# Patient Record
Sex: Female | Born: 1990 | Race: Black or African American | Hispanic: No | Marital: Single | Smoking: Never smoker
Health system: Southern US, Community
[De-identification: ages and names within clinical notes are randomized; demographics above are authoritative.]

## PROBLEM LIST (undated history)

## (undated) DIAGNOSIS — F419 Anxiety disorder, unspecified: Secondary | ICD-10-CM

## (undated) DIAGNOSIS — Z789 Other specified health status: Secondary | ICD-10-CM

## (undated) HISTORY — PX: TONSILLECTOMY: SUR1361

---

## 2015-09-16 NOTE — L&D Delivery Note (Signed)
Delivery Note Patient is a 25 y.o. now [id number] who admitted for SOL with SROM at 11:30pm, now s/p NSVD at [id number]  At 1:40 AM a viable female was delivered via Vaginal, Spontaneous Delivery. Head delivered LOA. No nuchal cord present. Shoulder and body delivered in usual fashion. Infant to mother's abdomen. Cord clamped x 2 after 1-minute delay, and cut by FOB. Cord blood drawn. Placenta delivered with gentle cord traction. Fundus firm with massage and Pitocin. Perineum inspected and found to have only a perineal abraison, which was found to be hemostatic.  Placenta status: intact.   Cord:  3-vessel  Anesthesia:  Epidural Episiotomy: None Lacerations: None Suture Repair: none Est. Blood Loss (mL): 100  Mom to postpartum.  Baby to Couplet care / Skin to Skin.  Kandra NicolasJulie P Degele 05/31/2016, 2:11 AM  I have seen and examined this patient and I agree with the above. Cam HaiSHAW, Awanda Wilcock CNM 9:17 AM 05/31/2016

## 2016-05-10 ENCOUNTER — Encounter (HOSPITAL_COMMUNITY): Payer: Self-pay | Admitting: *Deleted

## 2016-05-10 ENCOUNTER — Inpatient Hospital Stay (HOSPITAL_COMMUNITY)
Admission: AD | Admit: 2016-05-10 | Discharge: 2016-05-10 | Disposition: A | Discharge status: Home / Self Care | Payer: Medicaid Other | Source: Ambulatory Visit | Attending: Obstetrics & Gynecology | Admitting: Obstetrics & Gynecology

## 2016-05-10 DIAGNOSIS — O471 False labor at or after 37 completed weeks of gestation: Secondary | ICD-10-CM | POA: Diagnosis present

## 2016-05-10 DIAGNOSIS — Z3A38 38 weeks gestation of pregnancy: Secondary | ICD-10-CM | POA: Insufficient documentation

## 2016-05-10 HISTORY — DX: Other specified health status: Z78.9

## 2016-05-10 HISTORY — DX: Anxiety disorder, unspecified: F41.9

## 2016-05-10 LAB — RAPID URINE DRUG SCREEN, HOSP PERFORMED
AMPHETAMINES: NOT DETECTED
BENZODIAZEPINES: NOT DETECTED
Barbiturates: NOT DETECTED
COCAINE: NOT DETECTED
OPIATES: NOT DETECTED
TETRAHYDROCANNABINOL: NOT DETECTED

## 2016-05-10 LAB — URINALYSIS, ROUTINE W REFLEX MICROSCOPIC
Glucose, UA: NEGATIVE mg/dL
HGB URINE DIPSTICK: NEGATIVE
Ketones, ur: 40 mg/dL — AB
LEUKOCYTES UA: NEGATIVE
NITRITE: NEGATIVE
PROTEIN: 30 mg/dL — AB
pH: 6 (ref 5.0–8.0)

## 2016-05-10 LAB — URINE MICROSCOPIC-ADD ON

## 2016-05-10 NOTE — MAU Note (Signed)
Pt states she started having contractions this morning.  Pt states she got in an argument with her mom and then her contractions got worse.

## 2016-05-10 NOTE — Discharge Instructions (Signed)

## 2016-05-20 ENCOUNTER — Encounter (HOSPITAL_COMMUNITY): Payer: Self-pay | Admitting: *Deleted

## 2016-05-20 ENCOUNTER — Inpatient Hospital Stay (HOSPITAL_COMMUNITY)
Admission: AD | Admit: 2016-05-20 | Discharge: 2016-05-20 | Disposition: A | Discharge status: Home / Self Care | Payer: Medicaid Other | Source: Ambulatory Visit | Attending: Family Medicine | Admitting: Family Medicine

## 2016-05-20 DIAGNOSIS — Z3A00 Weeks of gestation of pregnancy not specified: Secondary | ICD-10-CM | POA: Diagnosis not present

## 2016-05-20 DIAGNOSIS — Z3493 Encounter for supervision of normal pregnancy, unspecified, third trimester: Secondary | ICD-10-CM | POA: Diagnosis not present

## 2016-05-20 NOTE — Progress Notes (Signed)
Notified of pt arrival in MAU and complaint, SVE, and FHR tracing. OK to discharge home and follow up with OB. Labor reassurance given.

## 2016-05-20 NOTE — MAU Note (Signed)
PT SAYS DECREASED FETAL  MOVEMENT  -  IN CAR.    HEADACHE  STARTED    5 PM-    NO MEDS,.     FEELS UC- STRONG  AT 2 PM.    [facility]-     [facility] - HAS MOVED TO [place]  AND  WANTS  TO COME  HERE-  BUT  HAS  NOT  TRANSFERRED  PNR OR  DR.        VE IN OFFICE  ON WED -  VE   3  CM.    DENIES HSV AND MRSA     GBS- POSITIVE

## 2016-05-20 NOTE — Discharge Instructions (Signed)
Third Trimester of Pregnancy °The third trimester is from week 29 through week 42, months 7 through 9. The third trimester is a time when the fetus is growing rapidly. At the end of the ninth month, the fetus is about 20 inches in length and weighs 6-10 pounds.  °BODY CHANGES °Your body goes through many changes during pregnancy. The changes vary from woman to woman.  °· Your weight will continue to increase. You can expect to gain 25-35 pounds (11-16 kg) by the end of the pregnancy. °· You may begin to get stretch marks on your hips, abdomen, and breasts. °· You may urinate more often because the fetus is moving lower into your pelvis and pressing on your bladder. °· You may develop or continue to have heartburn as a result of your pregnancy. °· You may develop constipation because certain hormones are causing the muscles that push waste through your intestines to slow down. °· You may develop hemorrhoids or swollen, bulging veins (varicose veins). °· You may have pelvic pain because of the weight gain and pregnancy hormones relaxing your joints between the bones in your pelvis. Backaches may result from overexertion of the muscles supporting your posture. °· You may have changes in your hair. These can include thickening of your hair, rapid growth, and changes in texture. Some women also have hair loss during or after pregnancy, or hair that feels dry or thin. Your hair will most likely return to normal after your baby is born. °· Your breasts will continue to grow and be tender. A yellow discharge may leak from your breasts called colostrum. °· Your belly button may stick out. °· You may feel short of breath because of your expanding uterus. °· You may notice the fetus "dropping," or moving lower in your abdomen. °· You may have a bloody mucus discharge. This usually occurs a few days to a week before labor begins. °· Your cervix becomes thin and soft (effaced) near your due date. °WHAT TO EXPECT AT YOUR PRENATAL  EXAMS  °You will have prenatal exams every 2 weeks until week 36. Then, you will have weekly prenatal exams. During a routine prenatal visit: °· You will be weighed to make sure you and the fetus are growing normally. °· Your blood pressure is taken. °· Your abdomen will be measured to track your baby's growth. °· The fetal heartbeat will be listened to. °· Any test results from the previous visit will be discussed. °· You may have a cervical check near your due date to see if you have effaced. °At around 36 weeks, your caregiver will check your cervix. At the same time, your caregiver will also perform a test on the secretions of the vaginal tissue. This test is to determine if a type of bacteria, Group B streptococcus, is present. Your caregiver will explain this further. °Your caregiver may ask you: °· What your birth plan is. °· How you are feeling. °· If you are feeling the baby move. °· If you have had any abnormal symptoms, such as leaking fluid, bleeding, severe headaches, or abdominal cramping. °· If you are using any tobacco products, including cigarettes, chewing tobacco, and electronic cigarettes. °· If you have any questions. °Other tests or screenings that may be performed during your third trimester include: °· Blood tests that check for low iron levels (anemia). °· Fetal testing to check the health, activity level, and growth of the fetus. Testing is done if you have certain medical conditions or if   there are problems during the pregnancy. °· HIV (human immunodeficiency virus) testing. If you are at high risk, you may be screened for HIV during your third trimester of pregnancy. °FALSE LABOR °You may feel small, irregular contractions that eventually go away. These are called Braxton Hicks contractions, or false labor. Contractions may last for hours, days, or even weeks before true labor sets in. If contractions come at regular intervals, intensify, or become painful, it is best to be seen by your  caregiver.  °SIGNS OF LABOR  °· Menstrual-like cramps. °· Contractions that are 5 minutes apart or less. °· Contractions that start on the top of the uterus and spread down to the lower abdomen and back. °· A sense of increased pelvic pressure or back pain. °· A watery or bloody mucus discharge that comes from the vagina. °If you have any of these signs before the 37th week of pregnancy, call your caregiver right away. You need to go to the hospital to get checked immediately. °HOME CARE INSTRUCTIONS  °· Avoid all smoking, herbs, alcohol, and unprescribed drugs. These chemicals affect the formation and growth of the baby. °· Do not use any tobacco products, including cigarettes, chewing tobacco, and electronic cigarettes. If you need help quitting, ask your health care provider. You may receive counseling support and other resources to help you quit. °· Follow your caregiver's instructions regarding medicine use. There are medicines that are either safe or unsafe to take during pregnancy. °· Exercise only as directed by your caregiver. Experiencing uterine cramps is a good sign to stop exercising. °· Continue to eat regular, healthy meals. °· Wear a good support bra for breast tenderness. °· Do not use hot tubs, steam rooms, or saunas. °· Wear your seat belt at all times when driving. °· Avoid raw meat, uncooked cheese, cat litter boxes, and soil used by cats. These carry germs that can cause birth defects in the baby. °· Take your prenatal vitamins. °· Take 1500-2000 mg of calcium daily starting at the 20th week of pregnancy until you deliver your baby. °· Try taking a stool softener (if your caregiver approves) if you develop constipation. Eat more high-fiber foods, such as fresh vegetables or fruit and whole grains. Drink plenty of fluids to keep your urine clear or pale yellow. °· Take warm sitz baths to soothe any pain or discomfort caused by hemorrhoids. Use hemorrhoid cream if your caregiver approves. °· If  you develop varicose veins, wear support hose. Elevate your feet for 15 minutes, 3-4 times a day. Limit salt in your diet. °· Avoid heavy lifting, wear low heal shoes, and practice good posture. °· Rest a lot with your legs elevated if you have leg cramps or low back pain. °· Visit your dentist if you have not gone during your pregnancy. Use a soft toothbrush to brush your teeth and be gentle when you floss. °· A sexual relationship may be continued unless your caregiver directs you otherwise. °· Do not travel far distances unless it is absolutely necessary and only with the approval of your caregiver. °· Take prenatal classes to understand, practice, and ask questions about the labor and delivery. °· Make a trial run to the hospital. °· Pack your hospital bag. °· Prepare the baby's nursery. °· Continue to go to all your prenatal visits as directed by your caregiver. °SEEK MEDICAL CARE IF: °· You are unsure if you are in labor or if your water has broken. °· You have dizziness. °· You have   mild pelvic cramps, pelvic pressure, or nagging pain in your abdominal area. °· You have persistent nausea, vomiting, or diarrhea. °· You have a bad smelling vaginal discharge. °· You have pain with urination. °SEEK IMMEDIATE MEDICAL CARE IF:  °· You have a fever. °· You are leaking fluid from your vagina. °· You have spotting or bleeding from your vagina. °· You have severe abdominal cramping or pain. °· You have rapid weight loss or gain. °· You have shortness of breath with chest pain. °· You notice sudden or extreme swelling of your face, hands, ankles, feet, or legs. °· You have not felt your baby move in over an hour. °· You have severe headaches that do not go away with medicine. °· You have vision changes. °  °This information is not intended to replace advice given to you by your health care provider. Make sure you discuss any questions you have with your health care provider. °  °Document Released: 08/26/2001 Document  Revised: 09/22/2014 Document Reviewed: 11/02/2012 °Elsevier Interactive Patient Education ©2016 [employer] °Fetal Movement Counts °Patient Name: __________________________________________________ Patient Due Date: ____________________ °Performing a fetal movement count is highly recommended in high-risk pregnancies, but it is good for every pregnant woman to do. Your health care provider may ask you to start counting fetal movements at 28 weeks of the pregnancy. Fetal movements often increase: °· After eating a full meal. °· After physical activity. °· After eating or drinking something sweet or cold. °· At rest. °Pay attention to when you feel the baby is most active. This will help you notice a pattern of your baby's sleep and wake cycles and what factors contribute to an increase in fetal movement. It is important to perform a fetal movement count at the same time each day when your baby is normally most active.  °HOW TO COUNT FETAL MOVEMENTS °1. Find a quiet and comfortable area to sit or lie down on your left side. Lying on your left side provides the best blood and oxygen circulation to your baby. °2. Write down the day and time on a sheet of paper or in a journal. °3. Start counting kicks, flutters, swishes, rolls, or jabs in a 2-hour period. You should feel at least 10 movements within 2 hours. °4. If you do not feel 10 movements in 2 hours, wait 2-3 hours and count again. Look for a change in the pattern or not enough counts in 2 hours. °SEEK MEDICAL CARE IF: °· You feel less than 10 counts in 2 hours, tried twice. °· There is no movement in over an hour. °· The pattern is changing or taking longer each day to reach 10 counts in 2 hours. °· You feel the baby is not moving as he or she usually does. °Date: ____________ Movements: ____________ Start time: ____________ Finish time: ____________  °Date: ____________ Movements: ____________ Start time: ____________ Finish time: ____________ °Date:  ____________ Movements: ____________ Start time: ____________ Finish time: ____________ °Date: ____________ Movements: ____________ Start time: ____________ Finish time: ____________ °Date: ____________ Movements: ____________ Start time: ____________ Finish time: ____________ °Date: ____________ Movements: ____________ Start time: ____________ Finish time: ____________ °Date: ____________ Movements: ____________ Start time: ____________ Finish time: ____________ °Date: ____________ Movements: ____________ Start time: ____________ Finish time: ____________  °Date: ____________ Movements: ____________ Start time: ____________ Finish time: ____________ °Date: ____________ Movements: ____________ Start time: ____________ Finish time: ____________ °Date: ____________ Movements: ____________ Start time: ____________ Finish time: ____________ °Date: ____________ Movements: ____________ Start time: ____________ Finish time: ____________ °Date:   ____________ Movements: ____________ Start time: ____________ Finish time: ____________ °Date: ____________ Movements: ____________ Start time: ____________ Finish time: ____________ °Date: ____________ Movements: ____________ Start time: ____________ Finish time: ____________  °Date: ____________ Movements: ____________ Start time: ____________ Finish time: ____________ °Date: ____________ Movements: ____________ Start time: ____________ Finish time: ____________ °Date: ____________ Movements: ____________ Start time: ____________ Finish time: ____________ °Date: ____________ Movements: ____________ Start time: ____________ Finish time: ____________ °Date: ____________ Movements: ____________ Start time: ____________ Finish time: ____________ °Date: ____________ Movements: ____________ Start time: ____________ Finish time: ____________ °Date: ____________ Movements: ____________ Start time: ____________ Finish time: ____________  °Date: ____________ Movements: ____________ Start  time: ____________ Finish time: ____________ °Date: ____________ Movements: ____________ Start time: ____________ Finish time: ____________ °Date: ____________ Movements: ____________ Start time: ____________ Finish time: ____________ °Date: ____________ Movements: ____________ Start time: ____________ Finish time: ____________ °Date: ____________ Movements: ____________ Start time: ____________ Finish time: ____________ °Date: ____________ Movements: ____________ Start time: ____________ Finish time: ____________ °Date: ____________ Movements: ____________ Start time: ____________ Finish time: ____________  °Date: ____________ Movements: ____________ Start time: ____________ Finish time: ____________ °Date: ____________ Movements: ____________ Start time: ____________ Finish time: ____________ °Date: ____________ Movements: ____________ Start time: ____________ Finish time: ____________ °Date: ____________ Movements: ____________ Start time: ____________ Finish time: ____________ °Date: ____________ Movements: ____________ Start time: ____________ Finish time: ____________ °Date: ____________ Movements: ____________ Start time: ____________ Finish time: ____________ °Date: ____________ Movements: ____________ Start time: ____________ Finish time: ____________  °Date: ____________ Movements: ____________ Start time: ____________ Finish time: ____________ °Date: ____________ Movements: ____________ Start time: ____________ Finish time: ____________ °Date: ____________ Movements: ____________ Start time: ____________ Finish time: ____________ °Date: ____________ Movements: ____________ Start time: ____________ Finish time: ____________ °Date: ____________ Movements: ____________ Start time: ____________ Finish time: ____________ °Date: ____________ Movements: ____________ Start time: ____________ Finish time: ____________ °Date: ____________ Movements: ____________ Start time: ____________ Finish time: ____________    °Date: ____________ Movements: ____________ Start time: ____________ Finish time: ____________ °Date: ____________ Movements: ____________ Start time: ____________ Finish time: ____________ °Date: ____________ Movements: ____________ Start time: ____________ Finish time: ____________ °Date: ____________ Movements: ____________ Start time: ____________ Finish time: ____________ °Date: ____________ Movements: ____________ Start time: ____________ Finish time: ____________ °Date: ____________ Movements: ____________ Start time: ____________ Finish time: ____________ °Date: ____________ Movements: ____________ Start time: ____________ Finish time: ____________  °Date: ____________ Movements: ____________ Start time: ____________ Finish time: ____________ °Date: ____________ Movements: ____________ Start time: ____________ Finish time: ____________ °Date: ____________ Movements: ____________ Start time: ____________ Finish time: ____________ °Date: ____________ Movements: ____________ Start time: ____________ Finish time: ____________ °Date: ____________ Movements: ____________ Start time: ____________ Finish time: ____________ °Date: ____________ Movements: ____________ Start time: ____________ Finish time: ____________ °  °This information is not intended to replace advice given to you by your health care provider. Make sure you discuss any questions you have with your health care provider. °  °Document Released: 10/01/2006 Document Revised: 09/22/2014 Document Reviewed: 06/28/2012 °[employer] Interactive Patient Education ©2016 [employer] °Braxton Hicks Contractions °Contractions of the uterus can occur throughout pregnancy. Contractions are not always a sign that you are in labor.  °WHAT ARE BRAXTON HICKS CONTRACTIONS?  °Contractions that occur before labor are called Braxton Hicks contractions, or false labor. Toward the end of pregnancy (32-34 weeks), these contractions can develop more often and may become more  forceful. This is not true labor because these contractions do not result in opening (dilatation) and thinning of the cervix. They are sometimes difficult to tell apart from true labor because these contractions can be forceful and people have different pain tolerances. You   should not feel embarrassed if you go to the hospital with false labor. Sometimes, the only way to tell if you are in true labor is for your health care provider to look for changes in the cervix. °If there are no prenatal problems or other health problems associated with the pregnancy, it is completely safe to be sent home with false labor and await the onset of true labor. °HOW CAN YOU TELL THE DIFFERENCE BETWEEN TRUE AND FALSE LABOR? °False Labor °· The contractions of false labor are usually shorter and not as hard as those of true labor.   °· The contractions are usually irregular.   °· The contractions are often felt in the front of the lower abdomen and in the groin.   °· The contractions may go away when you walk around or change positions while lying down.   °· The contractions get weaker and are shorter lasting as time goes on.   °· The contractions do not usually become progressively stronger, regular, and closer together as with true labor.   °True Labor °· Contractions in true labor last 30-70 seconds, become very regular, usually become more intense, and increase in frequency.   °· The contractions do not go away with walking.   °· The discomfort is usually felt in the top of the uterus and spreads to the lower abdomen and low back.   °· True labor can be determined by your health care provider with an exam. This will show that the cervix is dilating and getting thinner.   °WHAT TO REMEMBER °· Keep up with your usual exercises and follow other instructions given by your health care provider.   °· Take medicines as directed by your health care provider.   °· Keep your regular prenatal appointments.   °· Eat and drink lightly if you  think you are going into labor.   °· If Braxton Hicks contractions are making you uncomfortable:   °¨ Change your position from lying down or resting to walking, or from walking to resting.   °¨ Sit and rest in a tub of warm water.   °¨ Drink 2-3 glasses of water. Dehydration may cause these contractions.   °¨ Do slow and deep breathing several times an hour.   °WHEN SHOULD I SEEK IMMEDIATE MEDICAL CARE? °Seek immediate medical care if: °· Your contractions become stronger, more regular, and closer together.   °· You have fluid leaking or gushing from your vagina.   °· You have a fever.   °· You pass blood-tinged mucus.   °· You have vaginal bleeding.   °· You have continuous abdominal pain.   °· You have low back pain that you never had before.   °· You feel your baby's head pushing down and causing pelvic pressure.   °· Your baby is not moving as much as it used to.   °  °This information is not intended to replace advice given to you by your health care provider. Make sure you discuss any questions you have with your health care provider. °  °Document Released: 09/01/2005 Document Revised: 09/06/2013 Document Reviewed: 06/13/2013 °[employer]vier Interactive Patient Education ©2016 [employer] ° °

## 2016-05-31 ENCOUNTER — Inpatient Hospital Stay (HOSPITAL_COMMUNITY)
Admission: AD | Admit: 2016-05-31 | Discharge: 2016-06-02 | DRG: 775 | Disposition: A | Discharge status: Home / Self Care | Payer: Medicaid Other | Source: Ambulatory Visit | Attending: Obstetrics and Gynecology | Admitting: Obstetrics and Gynecology

## 2016-05-31 ENCOUNTER — Inpatient Hospital Stay (HOSPITAL_COMMUNITY): Payer: Medicaid Other | Admitting: Anesthesiology

## 2016-05-31 ENCOUNTER — Encounter (HOSPITAL_COMMUNITY): Payer: Self-pay | Admitting: *Deleted

## 2016-05-31 DIAGNOSIS — O99824 Streptococcus B carrier state complicating childbirth: Secondary | ICD-10-CM | POA: Diagnosis present

## 2016-05-31 DIAGNOSIS — IMO0001 Reserved for inherently not codable concepts without codable children: Secondary | ICD-10-CM

## 2016-05-31 DIAGNOSIS — Z3A40 40 weeks gestation of pregnancy: Secondary | ICD-10-CM | POA: Diagnosis not present

## 2016-05-31 DIAGNOSIS — O4202 Full-term premature rupture of membranes, onset of labor within 24 hours of rupture: Principal | ICD-10-CM | POA: Diagnosis present

## 2016-05-31 LAB — CBC
HCT: 31.2 % — ABNORMAL LOW (ref 36.0–46.0)
Hemoglobin: 10.8 g/dL — ABNORMAL LOW (ref 12.0–15.0)
MCH: 30.7 pg (ref 26.0–34.0)
MCHC: 34.6 g/dL (ref 30.0–36.0)
MCV: 88.6 fL (ref 78.0–100.0)
PLATELETS: 225 10*3/uL (ref 150–400)
RBC: 3.52 MIL/uL — ABNORMAL LOW (ref 3.87–5.11)
RDW: 13.7 % (ref 11.5–15.5)
WBC: 12.4 10*3/uL — ABNORMAL HIGH (ref 4.0–10.5)

## 2016-05-31 LAB — TYPE AND SCREEN
ABO/RH(D): A POS
Antibody Screen: NEGATIVE

## 2016-05-31 LAB — RPR: RPR: NONREACTIVE

## 2016-05-31 LAB — ABO/RH: ABO/RH(D): A POS

## 2016-05-31 MED ORDER — SENNOSIDES-DOCUSATE SODIUM 8.6-50 MG PO TABS
2.0000 | ORAL_TABLET | ORAL | Status: DC
  Administered 2016-05-31: 2 via ORAL
  Filled 2016-05-31 (×2): qty 2

## 2016-05-31 MED ORDER — SODIUM CHLORIDE 0.9 % IV SOLN
2.0000 g | Freq: Once | INTRAVENOUS | Status: AC
  Administered 2016-05-31: 2 g via INTRAVENOUS
  Filled 2016-05-31: qty 2000

## 2016-05-31 MED ORDER — ACETAMINOPHEN 325 MG PO TABS
650.0000 mg | ORAL_TABLET | ORAL | Status: DC | PRN
Start: 2016-05-31 — End: 2016-05-31

## 2016-05-31 MED ORDER — FLEET ENEMA 7-19 GM/118ML RE ENEM: 1.0000 | ENEMA | RECTAL | Status: DC | PRN

## 2016-05-31 MED ORDER — SOD CITRATE-CITRIC ACID 500-334 MG/5ML PO SOLN: 30.0000 mL | ORAL | Status: DC | PRN

## 2016-05-31 MED ORDER — OXYCODONE-ACETAMINOPHEN 5-325 MG PO TABS: 2.0000 | ORAL_TABLET | ORAL | Status: DC | PRN

## 2016-05-31 MED ORDER — EPHEDRINE 5 MG/ML INJ: 10.0000 mg | INTRAVENOUS | Status: DC | PRN

## 2016-05-31 MED ORDER — FENTANYL 2.5 MCG/ML BUPIVACAINE 1/10 % EPIDURAL INFUSION (WH - ANES)
14.0000 mL/h | INTRAMUSCULAR | Status: DC | PRN
  Administered 2016-05-31: 14 mL/h via EPIDURAL

## 2016-05-31 MED ORDER — LACTATED RINGERS IV SOLN: 500.0000 mL | INTRAVENOUS | Status: DC | PRN

## 2016-05-31 MED ORDER — FENTANYL 2.5 MCG/ML BUPIVACAINE 1/10 % EPIDURAL INFUSION (WH - ANES): 14.0000 mL/h | INTRAMUSCULAR | Status: DC | PRN

## 2016-05-31 MED ORDER — ACETAMINOPHEN 325 MG PO TABS
650.0000 mg | ORAL_TABLET | ORAL | Status: DC | PRN
  Administered 2016-05-31: 650 mg via ORAL
  Filled 2016-05-31: qty 2

## 2016-05-31 MED ORDER — LACTATED RINGERS IV SOLN: 500.0000 mL | Freq: Once | INTRAVENOUS | Status: DC

## 2016-05-31 MED ORDER — WITCH HAZEL-GLYCERIN EX PADS: 1.0000 "application " | MEDICATED_PAD | CUTANEOUS | Status: DC | PRN

## 2016-05-31 MED ORDER — OXYTOCIN 40 UNITS IN LACTATED RINGERS INFUSION - SIMPLE MED
2.5000 [IU]/h | INTRAVENOUS | Status: DC
  Filled 2016-05-31: qty 1000

## 2016-05-31 MED ORDER — DIBUCAINE 1 % RE OINT: 1.0000 "application " | TOPICAL_OINTMENT | RECTAL | Status: DC | PRN

## 2016-05-31 MED ORDER — PHENYLEPHRINE 40 MCG/ML (10ML) SYRINGE FOR IV PUSH (FOR BLOOD PRESSURE SUPPORT): 80.0000 ug | PREFILLED_SYRINGE | INTRAVENOUS | Status: DC | PRN

## 2016-05-31 MED ORDER — EPHEDRINE 5 MG/ML INJ
10.0000 mg | INTRAVENOUS | Status: DC | PRN
  Filled 2016-05-31: qty 4

## 2016-05-31 MED ORDER — ONDANSETRON HCL 4 MG PO TABS: 4.0000 mg | ORAL_TABLET | ORAL | Status: DC | PRN

## 2016-05-31 MED ORDER — OXYTOCIN BOLUS FROM INFUSION
500.0000 mL | Freq: Once | INTRAVENOUS | Status: AC
  Administered 2016-05-31: 500 mL via INTRAVENOUS

## 2016-05-31 MED ORDER — PHENYLEPHRINE 40 MCG/ML (10ML) SYRINGE FOR IV PUSH (FOR BLOOD PRESSURE SUPPORT)
80.0000 ug | PREFILLED_SYRINGE | INTRAVENOUS | Status: DC | PRN
  Filled 2016-05-31: qty 5

## 2016-05-31 MED ORDER — LACTATED RINGERS IV SOLN
INTRAVENOUS | Status: DC
  Administered 2016-05-31: 01:00:00 via INTRAVENOUS

## 2016-05-31 MED ORDER — PRENATAL MULTIVITAMIN CH
1.0000 | ORAL_TABLET | Freq: Every day | ORAL | Status: DC
  Administered 2016-05-31 – 2016-06-02 (×3): 1 via ORAL
  Filled 2016-05-31 (×3): qty 1

## 2016-05-31 MED ORDER — LIDOCAINE HCL (PF) 1 % IJ SOLN
INTRAMUSCULAR | Status: DC | PRN
  Administered 2016-05-31 (×2): 5 mL

## 2016-05-31 MED ORDER — DIPHENHYDRAMINE HCL 50 MG/ML IJ SOLN: 12.5000 mg | INTRAMUSCULAR | Status: DC | PRN

## 2016-05-31 MED ORDER — ZOLPIDEM TARTRATE 5 MG PO TABS: 5.0000 mg | ORAL_TABLET | Freq: Every evening | ORAL | Status: DC | PRN

## 2016-05-31 MED ORDER — IBUPROFEN 600 MG PO TABS
600.0000 mg | ORAL_TABLET | Freq: Four times a day (QID) | ORAL | Status: DC
  Administered 2016-05-31 – 2016-06-02 (×3): 600 mg via ORAL
  Filled 2016-05-31 (×8): qty 1

## 2016-05-31 MED ORDER — ONDANSETRON HCL 4 MG/2ML IJ SOLN: 4.0000 mg | Freq: Four times a day (QID) | INTRAMUSCULAR | Status: DC | PRN

## 2016-05-31 MED ORDER — ONDANSETRON HCL 4 MG/2ML IJ SOLN: 4.0000 mg | INTRAMUSCULAR | Status: DC | PRN

## 2016-05-31 MED ORDER — LIDOCAINE HCL (PF) 1 % IJ SOLN
30.0000 mL | INTRAMUSCULAR | Status: DC | PRN
  Filled 2016-05-31: qty 30

## 2016-05-31 MED ORDER — IBUPROFEN 600 MG PO TABS
600.0000 mg | ORAL_TABLET | Freq: Four times a day (QID) | ORAL | Status: DC | PRN
  Administered 2016-05-31 – 2016-06-01 (×2): 600 mg via ORAL
  Filled 2016-05-31: qty 1

## 2016-05-31 MED ORDER — BENZOCAINE-MENTHOL 20-0.5 % EX AERO: 1.0000 "application " | INHALATION_SPRAY | CUTANEOUS | Status: DC | PRN

## 2016-05-31 MED ORDER — OXYCODONE-ACETAMINOPHEN 5-325 MG PO TABS: 1.0000 | ORAL_TABLET | ORAL | Status: DC | PRN

## 2016-05-31 MED ORDER — COCONUT OIL OIL
1.0000 "application " | TOPICAL_OIL | Status: DC | PRN
  Filled 2016-05-31: qty 120

## 2016-05-31 MED ORDER — TETANUS-DIPHTH-ACELL PERTUSSIS 5-2.5-18.5 LF-MCG/0.5 IM SUSP: 0.5000 mL | Freq: Once | INTRAMUSCULAR | Status: DC

## 2016-05-31 MED ORDER — DIPHENHYDRAMINE HCL 25 MG PO CAPS: 25.0000 mg | ORAL_CAPSULE | Freq: Four times a day (QID) | ORAL | Status: DC | PRN

## 2016-05-31 MED ORDER — SIMETHICONE 80 MG PO CHEW: 80.0000 mg | CHEWABLE_TABLET | ORAL | Status: DC | PRN

## 2016-05-31 NOTE — Anesthesia Preprocedure Evaluation (Signed)
Anesthesia Evaluation  Patient identified by MRN, date of birth, ID band Patient awake    Reviewed: Allergy & Precautions, NPO status , Patient's Chart, lab work & pertinent test results  Airway Mallampati: II  TM Distance: >3 FB Neck ROM: Full    Dental no notable dental hx.    Pulmonary neg pulmonary ROS,    Pulmonary exam normal        Cardiovascular negative cardio ROS Normal cardiovascular exam     Neuro/Psych negative neurological ROS  negative psych ROS   GI/Hepatic negative GI ROS, Neg liver ROS,   Endo/Other  negative endocrine ROS  Renal/GU negative Renal ROS  negative genitourinary   Musculoskeletal negative musculoskeletal ROS (+)   Abdominal   Peds negative pediatric ROS (+)  Hematology negative hematology ROS (+)   Anesthesia Other Findings   Reproductive/Obstetrics (+) Pregnancy                             Anesthesia Physical Anesthesia Plan  ASA: II  Anesthesia Plan: Epidural   Post-op Pain Management:    Induction:   Airway Management Planned:   Additional Equipment:   Intra-op Plan:   Post-operative Plan:   Informed Consent:   Plan Discussed with:   Anesthesia Plan Comments:         Anesthesia Quick Evaluation  

## 2016-05-31 NOTE — Lactation Note (Signed)
This note was copied from a baby's chart. Lactation Consultation Note  Patient Name: Kathryn Stephens's Date: 05/31/2016 Reason for consult: Initial assessment   Initial consult with mom of 13 hour old infant. Mom reports she was not able to BF her 89 yo as she would not latch. Mom reports 89 yo had a tongue tie that was clipped at 306 weeks old. Mom had her first infant in [place].   Infant in crib cueing to feed, dad was giving her a pacifier. Discussed risks of using pacifier in the NB period. Mom reports infant is sleepy at breast and they are trying to keep her awake with feeding. We reviewed awakening techniques.   Offered to assist mom in latching infant to breast, mom agreed. Assisted mom in latching infant to left breast in cross cradle hold, Infant would not latch. We then positioned her to the right breast in the football hold. Infant does not open wide, she latched with curled lips and mom reported pain with latch. Showed parents how to flange lips and mom reported pain improved. Discussed with mom importance of pillow support. Infant nursed for about 10 minutes and pulled off. She was rooting and was relatched to breast, she nursed for a while longer and then fell asleep. Infant was sleepy and needed stimulation to stay awake at the breast.   Mom with large pendulous breasts with compressible flat nipples that evert with stimulation. I was unable to hand express colostrum during this session. Mom has a hand pump to stimulate nipple prior to latch. Enc mom to feed 8-12 x in 24 hours at first feeding cues, awakening infant as needed with feeding.   BF Resources and [facility] Brochure given, informed mom of IP/OP Services, BF Support Groups and LC phone #. Enc mom to call out to desk as needed for feeding assistance.      Maternal Data Formula Feeding for Exclusion: No Has patient been taught Hand Expression?: Yes Does the patient have breastfeeding experience prior to this delivery?:  No (Attempted to BF first child, infant would not latch, mom report infant had a tongue tie that was clipped)  Feeding Feeding Type: Breast Fed Length of feed: 15 min  LATCH Score/Interventions Latch: Repeated attempts needed to sustain latch, nipple held in mouth throughout feeding, stimulation needed to elicit sucking reflex. Intervention(s): Adjust position;Assist with latch;Breast massage;Breast compression  Audible Swallowing: A few with stimulation Intervention(s): Alternate breast massage;Skin to skin  Type of Nipple: Flat Intervention(s): Hand pump  Comfort (Breast/Nipple): Filling, red/small blisters or bruises, mild/mod discomfort  Problem noted: Mild/Moderate discomfort  Hold (Positioning): Assistance needed to correctly position infant at breast and maintain latch. Intervention(s): Breastfeeding basics reviewed;Support Pillows;Position options;Skin to skin  LATCH Score: 5  Lactation Tools Discussed/Used WIC Program: No   Consult Status Consult Status: Follow-up Date: 05/25/16 Follow-up type: In-patient    Kathryn Stephens 05/31/2016, 4:40 PM

## 2016-05-31 NOTE — H&P (Signed)
LABOR AND DELIVERY ADMISSION HISTORY AND PHYSICAL NOTE  Kathryn Stephens is a 25 y.o. female G3P1011 with IUP at [redacted]w[redacted]d by 9-wk U/S presenting for SROM at 11:30pm. Had onset of contractions soon after. Feeling them every 2-5 minutes.  She reports positive fetal movement.   Prenatal History/Complications: PNC at [facility] - History of UTI - GBS positive  Past Medical History: Past Medical History:  Diagnosis Date  . Anxiety   . Medical history non-contributory     Past Surgical History: Past Surgical History:  Procedure Laterality Date  . TONSILLECTOMY      Obstetrical History: OB History    Gravida Para Term Preterm AB Living   3 1 1   1 1    SAB TAB Ectopic Multiple Live Births   1       1      Social History: Social History   Social History  . Marital status: Single    Spouse name: N/A  . Number of children: N/A  . Years of education: N/A   Social History Main Topics  . Smoking status: Never Smoker  . Smokeless tobacco: Never Used  . Alcohol use No  . Drug use: No  . Sexual activity: Not on file   Other Topics Concern  . Not on file   Social History Narrative  . No narrative on file    Family History: No family history on file.  Allergies: Allergies  Allergen Reactions  . Tramadol Nausea Only    Prescriptions Prior to Admission  Medication Sig Dispense Refill Last Dose  . Prenatal Vit-Fe Fumarate-FA (PRENATAL MULTIVITAMIN) TABS tablet Take 1 tablet by mouth daily at 12 noon.   Past Month at Unknown time     Review of Systems  All systems reviewed and negative except as stated in HPI  Blood pressure 112/87, pulse 96, temperature 97.9 F (36.6 C), temperature source Oral, resp. rate 19, height 5\' 2"  (1.575 m), weight 172 lb (78 kg), last menstrual period 08/20/2015. General appearance: alert, appears in pain with contractions Lungs: clear to auscultation bilaterally Heart: regular rate and rhythm Abdomen: soft, non-tender; bowel sounds  normal Extremities: No calf swelling or tenderness Presentation: cephalic SVE: 4-cm per RN Fetal monitoring: 150  Prenatal labs: results available thru Care[facility]where ABO, Rh:  A +fd Antibody:  neg Rubella: immune RPR:   Nonreactive HBsAg:  Negative HIV:   Negative GBS:   Positive 1 hr Glucola: 104 Genetic screening:  normal Anatomy US: normal  Prenatal Transfer Tool  Maternal Diabetes: No Genetic Screening: Normal Maternal Ultrasounds/Referrals: Normal Fetal Ultrasounds or other Referrals:  None Maternal Substance Abuse:  No Significant Maternal Medications:  None Significant Maternal Lab Results: Lab values include: Group B Strep positive  Results for orders placed or performed during the hospital encounter of 05/31/16 (from the past 24 hour(s))  CBC   Collection Time: 05/31/16 12:35 AM  Result Value Ref Range   WBC 12.4 (H) 4.0 - 10.5 K/uL   RBC 3.52 (L) 3.87 - 5.11 MIL/uL   Hemoglobin 10.8 (L) 12.0 - 15.0 g/dL   HCT 16.1 (L) 09.6 - 04.5 %   MCV 88.6 78.0 - 100.0 fL   MCH 30.7 26.0 - 34.0 pg   MCHC 34.6 30.0 - 36.0 g/dL   RDW 40.9 81.1 - 91.4 %   Platelets 225 150 - 400 K/uL  Type and screen [facility]   Collection Time: 05/31/16 12:35 AM  Result Value Ref Range   ABO/RH(D) A  POS    Antibody Screen NEG    Sample Expiration 06/03/2016    Assessment: Kathryn Stephens is a 25 y.o. G3P1011 at 3387w6d here for SROM at 11:30pm and SOL.  #Labor: Expectant management #Pain: Patient would like epidural #FWB: Category I #ID:  GBS +, Ampicillin ordered given advanced labor #Circ:  N/a (girl)  Kandra NicolasJulie P Degele 05/31/2016, 12:39 AM   CNM attestation:  I have seen and examined this patient; I agree with above documentation in the resident's note.   Kathryn Stephens is a 25 y.o. G3P1011 here for SROM/SOL  PE: BP 115/60 (BP Location: Left Arm)   Pulse (!) 58   Temp 97.9 F (36.6 C) (Oral)   Resp 16   Ht 5\' 2"  (1.575 m)   Wt 78 kg (172 lb)   LMP  08/20/2015   BMI 31.46 kg/m   Resp: normal effort, no distress Abd: gravid  ROS, labs, PMH reviewed  [facility]: [facility] Expectant management Anticipate SVD Amp for GBS ppx  SHAW, KIMBERLY CNM 05/31/2016, 9:17 AM

## 2016-05-31 NOTE — Anesthesia Procedure Notes (Signed)
Epidural Patient location during procedure: OB Start time: 05/31/2016 1:32 AM End time: 05/31/2016 1:42 AM  Staffing Anesthesiologist: Bonita QuinGUIDETTI, Kathryn Willets S Performed: anesthesiologist   Preanesthetic Checklist Completed: patient identified, site marked, surgical consent, pre-op evaluation, timeout performed, IV checked, risks and benefits discussed and monitors and equipment checked  Epidural Patient position: sitting Prep: site prepped and draped and DuraPrep Patient monitoring: continuous pulse ox and blood pressure Approach: midline Location: L4-L5 Injection technique: LOR air  Needle:  Needle type: Tuohy  Needle gauge: 17 G Needle length: 9 cm and 9 Needle insertion depth: 7 cm Catheter type: closed end flexible Catheter size: 19 Gauge Catheter at skin depth: 12 cm Test dose: negative  Assessment Events: blood not aspirated, injection not painful, no injection resistance, negative IV test and no paresthesia

## 2016-05-31 NOTE — Anesthesia Postprocedure Evaluation (Signed)
Anesthesia Post Note  Patient: Kathryn Stephens  Procedure(s) Performed: * No procedures listed *  Patient location during evaluation: Mother Baby Anesthesia Type: Epidural Level of consciousness: awake and alert, awake, oriented and patient cooperative Pain management: pain level controlled Vital Signs Assessment: post-procedure vital signs reviewed and stable Respiratory status: nonlabored ventilation, spontaneous breathing and respiratory function stable Cardiovascular status: stable Postop Assessment: patient able to bend at knees, no headache, no backache and no signs of nausea or vomiting Anesthetic complications: no     Last Vitals:  Vitals:   05/31/16 0414 05/31/16 0510  BP: 113/61 131/72  Pulse: 82 74  Resp: 16 18  Temp: 37 C 36.9 C    Last Pain:  Vitals:   05/31/16 0510  TempSrc: Oral  PainSc: 0-No pain   Pain Goal:                 [name]

## 2016-06-01 NOTE — Progress Notes (Signed)
CSW  Met with pt to discuss consult for maternal history of anxiety.  Mom denies diagnosis.  CSW signing off.  Please re-consult as necessary.  Creta Levin, LCSW Weekend Coverage [phone]

## 2016-06-01 NOTE — Progress Notes (Addendum)
Post Partum Day 1 Subjective: Eating, drinking, voiding, ambulating well.  +flatus.  Lochia and pain wnl.  Denies dizziness, lightheadedness, or sob. No complaints.   Objective: Blood pressure (!) 100/55, pulse 87, temperature 98.3 F (36.8 C), temperature source Oral, resp. rate 18, height 5\' 2"  (1.575 m), weight 78 kg (172 lb), last menstrual period 08/20/2015.  Physical Exam:  General: alert, cooperative and no distress Lochia: appropriate Uterine Fundus: firm Incision: n/a DVT Evaluation: No evidence of DVT seen on physical exam. Negative Homan's sign. No cords or calf tenderness. No significant calf/ankle edema.   Recent Labs  05/31/16 0035  HGB 10.8*  HCT 31.2*    Assessment/Plan: Plan for discharge tomorrow, Breastfeeding and Contraception nexplanon GBS+ and inadequate tx d/t precipitous labor/birth, will d/c tomorrow   LOS: 1 day   Marge DuncansBooker, Kimberly Randall 06/01/2016, 6:13 AM

## 2016-06-01 NOTE — Lactation Note (Signed)
This note was copied from a baby's chart. Lactation Consultation Note  Patient Name: Kathryn Donney DiceKamille [name] DGUYQ'IToday's Date: 06/01/2016 Reason for consult: Follow-up assessment - 5% weight loss , at 5823 1/2 hours old - SBili 8.6  Baby is 4238 hours old , prior to consult - baby has been to the  Breast   Latch scores 5-6-7  According to doc flow sheets baby has been breast feeding 15 -20 mins . Voids and stools QS for age .  Baby awake and rooting at consult - LC assisted at 1st with football left breast, on and off latch , switched to  Cross cradle and improved , more depth and swallows. Baby became non - nutritive and mom had to go to the  Bathroom , released latch. LC recommended for mom to lay down on her left side - and trying lying position for latch. LC assisted to latch and baby opened her mouth wider than she had been . ( baby has tendency to want to nipple her way on to the  Breast) LC highly recommended to mom  to work with the baby to open wide for latching by tickling upper lip with her nipple until the baby opens  Wide and then aim her nipple towards the roof of the her mouth to obtain depth. Breast compressions with  Latch until swallows and then intermittent. Baby fed for 8 mins in side lying position increased swallows with this latch and with breast compressions. Also this position allows for moms tissue  To greet the high palate. Per mom comfortable. LC noted a pacifier in the crib - explained to the parents why one should be avoided in the 1st 3 weeks  Until after 3 rd week growth spurt and latching is going well.  Mom and dad are aware to call if they are having issues with latching.      Maternal Data Has patient been taught Hand Expression?: Yes  Feeding Feeding Type: Breast Fed Length of feed: 8 min (increased swallows / side lying position )  LATCH Score/Interventions Latch: Repeated attempts needed to sustain latch, nipple held in mouth throughout feeding, stimulation  needed to elicit sucking reflex. (challenge to egt baby to open wide , obtained depth, see LC note ) Intervention(s): Adjust position;Assist with latch;Breast massage;Breast compression  Audible Swallowing: Spontaneous and intermittent (increased with breast compressions )  Type of Nipple: Everted at rest and after stimulation  Comfort (Breast/Nipple): Soft / non-tender     Hold (Positioning): Assistance needed to correctly position infant at breast and maintain latch. Intervention(s): Breastfeeding basics reviewed;Support Pillows;Position options;Skin to skin  LATCH Score: 8  Lactation Tools Discussed/Used Tools: Pump Breast pump type: Manual   Consult Status Consult Status: Follow-up Date: 06/02/16 Follow-up type: In-patient    Kathrin Greathouseorio, Jaleigha Deane Ann 06/01/2016, 3:57 PM

## 2016-06-02 ENCOUNTER — Encounter (HOSPITAL_COMMUNITY): Payer: Self-pay | Admitting: *Deleted

## 2016-06-02 MED ORDER — IBUPROFEN 600 MG PO TABS: 600.0000 mg | ORAL_TABLET | Freq: Four times a day (QID) | ORAL | 0 refills | Status: AC

## 2016-06-02 NOTE — Lactation Note (Addendum)
This note was copied from a baby's chart. Lactation Consultation Note Baby has 8% weight loss. Discussing weight loss w/mom. Assessing colostrum. Noted moms breast filling. Breast getting heavy, noted ducts filling. Encouraged hand massage, hand expression, and frequent BF. Hand expression taught w/3 ml colostrum. Mom has pendulum breast w/flat nipples that compress easily. Noted to mom's inner aspect of Rt. Breast, a raw flat area draining brown's drainage on mom's white t-shirt. It just came up. Mom didn't know it was there. Reported to RN. Cleansed w/guaze, got mom to apply colostrum, gave mom a band aide to apply if wanted to. Encouraged to report to Dr. To assess. Prevent getting staff infection and mastitis. Discussed using DEBP. Mom stated she wanted to but was advised against it. Has hand pump in rm. And a DEBP at home.  Mom shown how to use DEBP & how to disassemble, clean, & reassemble parts. Mom knows to pump q3h for 15-20 min. Mom encouraged to do skin-to-skin. Hand express after pumping. Gave curve tip syring to give colostrum to baby.  Encouraged mom to BF every 2-3 hours. It had been since 10 pm since last feeding. Mom stated she wanted to let baby sleep. Explained weight loss, BF and importance of waking baby to BF. Encouraged to supplement w/colostrum after each BF.   Patient Name: Kathryn Stephens ZOXWR'UToday's Date: 06/02/2016 Reason for consult: Follow-up assessment;Infant weight loss   Maternal Data    Feeding    LATCH Score/Interventions       Type of Nipple: Flat Intervention(s): Shells;Double electric pump;Hand pump  Comfort (Breast/Nipple): Filling, red/small blisters or bruises, mild/mod discomfort  Problem noted: Filling;Mild/Moderate discomfort Interventions (Filling): Double electric pump;Hand pump;Frequent nursing;Massage;Firm support Interventions (Mild/moderate discomfort): Hand massage;Hand expression;Post-pump  Intervention(s): Breastfeeding basics  reviewed;Support Pillows;Position options;Skin to skin     Lactation Tools Discussed/Used Tools: Shells;Pump Breast pump type: Double-Electric Breast Pump Pump Review: Setup, frequency, and cleaning;Milk Storage Initiated by:: L> Lauran Romanski RN IBCLC Date initiated:: 06/02/16   Consult Status Consult Status: Follow-up Date: 06/02/16 Follow-up type: In-patient    Brieanna Nau, Diamond NickelLAURA G 06/02/2016, 1:42 AM

## 2016-06-02 NOTE — Lactation Note (Addendum)
This note was copied from a baby's chart. Lactation Consultation Note  Upon entering mother had just unlatched baby from L breast. She relatched baby on L breast.  Offered to give mother pillow to bring baby to nipple height but mother refused. She seemed to not want help w/ latch.  She did state she discussed sore on R breast with her MD and MD was not concerned. Mother states R nipple is tender.  Discussed making sure latch is deep, not only on tip of nipple and suggest she apply ebm to nipple. Intermittent sucks and swallows observed.  Suggest attempting to make latch deep but mother states she is ok. Reviewed engorgement care and monitoring voids/stools. Mom encouraged to feed baby 8-12 times/24 hours and with feeding cues.    Patient Name: Kathryn Stephens ZOXWR'UToday's Date: 06/02/2016 Reason for consult: Follow-up assessment   Maternal Data    Feeding Feeding Type: Breast Fed Length of feed: 10 min  LATCH Score/Interventions Latch: Repeated attempts needed to sustain latch, nipple held in mouth throughout feeding, stimulation needed to elicit sucking reflex.  Audible Swallowing: Spontaneous and intermittent  Type of Nipple: Everted at rest and after stimulation  Comfort (Breast/Nipple): Soft / non-tender     Hold (Positioning): No assistance needed to correctly position infant at breast.  LATCH Score: 9  Lactation Tools Discussed/Used     Consult Status Consult Status: Complete    Kathryn Stephens, [name] 06/02/2016, 9:53 AM

## 2016-06-02 NOTE — Progress Notes (Deleted)
    OB Discharge Summary     Patient Name: Kathryn DiceKamille Jauregui DOB: 09/01/1991 MRN: [id number]  Date of admission: 05/31/2016 Delivering MD: Frederik PearEGELE, JULIE P   Date of discharge: 06/02/2016  Admitting diagnosis: 41 weeks and water broke Intrauterine pregnancy: 2125w1d     Secondary diagnosis:  Active Problems:   Active labor at term  Additional problems: n/a     Discharge diagnosis: Term Pregnancy Delivered                                                                                                Post partum procedures:n/a  Augmentation: n/a  Complications: None  Hospital course:  Onset of Labor With Vaginal Delivery     25 y.o. yo G3P1011 at 8225w1d was admitted in Active Labor on 05/31/2016. Patient had an uncomplicated labor course as follows:  Membrane Rupture Time/Date: 11:00 PM ,05/30/2016   Intrapartum Procedures: Episiotomy: None [1]                                         Lacerations:  None [1]  Patient had a delivery of a Viable infant. 05/31/2016  Information for the patient's newborn:  [name], Girl Shelonda [[id number]][[id number]]  Delivery Method: Vaginal, Spontaneous Delivery (Filed from Delivery Summary)    Pateint had an uncomplicated postpartum course.  She is ambulating, tolerating a regular diet, passing flatus, and urinating well. Patient is discharged home in stable condition on 06/02/16.    Physical exam Vitals:   05/31/16 1748 06/01/16 0500 06/01/16 1900 06/02/16 0500  BP: (!) 100/55 107/66 106/65 114/64  Pulse: 87 77 87 69  Resp: 18 18 18 18   Temp: 98.3 F (36.8 C) 98 F (36.7 C) 98.5 F (36.9 C) 97.9 F (36.6 C)  TempSrc: Oral Oral Oral Oral  SpO2:   100%   Weight:      Height:       General: alert, cooperative and no distress Lochia: appropriate Uterine Fundus: firm Incision: N/A DVT Evaluation: No evidence of DVT seen on physical exam. Negative Homan's sign. No cords or calf tenderness. No significant calf/ankle edema. Labs: Lab Results  Component  Value Date   WBC 12.4 (H) 05/31/2016   HGB 10.8 (L) 05/31/2016   HCT 31.2 (L) 05/31/2016   MCV 88.6 05/31/2016   PLT 225 05/31/2016   No flowsheet data found.  Discharge instruction: per After Visit Summary and "Baby and Me Booklet".    Diet: routine diet  Activity: Advance as tolerated. Pelvic rest for 6 weeks.   Outpatient follow up:6 weeks Follow up Appt:No future appointments. Follow up Visit:No Follow-up on file.  Postpartum contraception: Nexplanon  Newborn Data: Live born female  Birth Weight: 8 lb 2.9 oz (3711 g) APGAR: 8, 9  Baby Feeding: Breast Disposition:home with mother   06/02/2016 Renetta ChalkAshley Sajjad Honea, Student-MidWife

## 2016-06-02 NOTE — Discharge Summary (Signed)
OB Discharge Summary                           Patient Name: Kathryn Stephens DOB: June 05, 1991 MRN: [id number]  Date of admission: 05/31/2016 Delivering MD: Frederik Pear   Date of discharge: 06/02/2016  Admitting diagnosis: 41 weeks and water broke Intrauterine pregnancy: [redacted]w[redacted]d     Secondary diagnosis:  Active Problems:   Active labor at term  Additional problems: n/a                                      Discharge diagnosis: Term Pregnancy Delivered                                                                                                Post partum procedures:n/a  Augmentation: n/a  Complications: None  Hospital course:  Onset of Labor With Vaginal Delivery     25 y.o. yo G3P1011 at [redacted]w[redacted]d was admitted in Active Labor on 05/31/2016. Patient had an uncomplicated labor course as follows:  Membrane Rupture Time/Date: 11:00 PM ,05/30/2016   Intrapartum Procedures: Episiotomy: None [1]                                         Lacerations:  None [1]  Patient had a delivery of a Viable infant. 05/31/2016  Information for the patient's newborn:  Lisandra, Mathisen [[id number]]  Delivery Method: Vaginal, Spontaneous Delivery (Filed from Delivery Summary)    Pateint had an uncomplicated postpartum course.  She is ambulating, tolerating a regular diet, passing flatus, and urinating well. Patient is discharged home in stable condition on 06/02/16.          Physical exam Vitals:   05/31/16 1748 06/01/16 0500 06/01/16 1900 06/02/16 0500  BP: (!) 100/55 107/66 106/65 114/64  Pulse: 87 77 87 69  Resp: 18 18 18 18   Temp: 98.3 F (36.8 C) 98 F (36.7 C) 98.5 F (36.9 C) 97.9 F (36.6 C)  TempSrc: Oral Oral Oral Oral  SpO2:   100%   Weight:      Height:       General: alert, cooperative and no distress Lochia: appropriate Uterine Fundus: firm Incision: N/A DVT Evaluation: No evidence of DVT seen on physical exam. Negative Homan's sign. No cords or  calf tenderness. No significant calf/ankle edema. Labs: Recent Labs       Lab Results  Component Value Date   WBC 12.4 (H) 05/31/2016   HGB 10.8 (L) 05/31/2016   HCT 31.2 (L) 05/31/2016   MCV 88.6 05/31/2016   PLT 225 05/31/2016     No flowsheet data found.  Discharge instruction: per After Visit Summary and "Baby and Me Booklet".    Diet: routine diet  Activity: Advance as tolerated. Pelvic rest for 6 weeks.   Outpatient follow up:6 weeks Follow up Appt:No future appointments. Follow up Visit:No Follow-up on  file.  Postpartum contraception: Nexplanon  Newborn Data: Live born female  Birth Weight: 8 lb 2.9 oz (3711 g) APGAR: 8, 9  Baby Feeding: Breast Disposition:home with mother   06/02/2016 Renetta ChalkAshley Ellis, Student-MidWife

## 2016-06-07 NOTE — Discharge Summary (Signed)
Kathryn Stephens, CNM  Obstetrics    [] Hide copied text OB Discharge Summary   Patient Name: Kathryn Stephens DOB:11/13/1990 BJY:[id number]  Date of admission:05/31/2016 Delivering MD: Kathryn Stephens   Date of discharge:06/02/2016  Admitting diagnosis:41 weeks and water broke Intrauterine pregnancy:3458w1d  Secondary diagnosis:Active Problems: Active labor at term  Additional problems: n/a  Discharge diagnosis: Term Pregnancy Delivered  Post partum procedures:n/a  Augmentation: n/a  Complications: None  Hospital course: Onset of Labor With Vaginal Delivery 24 y.o.yo G3P1011 at 5958w1d was admitted in Active Laboron 05/31/2016. Patient had an uncomplicated labor course as follows:  Membrane Rupture Time/Date: 11:00 PM ,05/30/2016  Intrapartum Procedures: Episiotomy: None [1]  Lacerations: None [1]  Patient had a delivery of a Viableinfant. 05/31/2016  Information for the patient's newborn:  Kathryn Stephens [[id number]][[id number]]  Delivery Method: Vaginal, Spontaneous Delivery (Filed from Delivery Summary)   Pateint had an uncomplicated postpartum course. She is ambulating, tolerating a regular diet, passing flatus, and urinating well. Patient is discharged home in stable condition on 06/02/16.          Physical examVitals:   05/31/16 1748 06/01/16 0500 06/01/16 1900 06/02/16 0500  BP: (!) 100/55 107/66 106/65 114/64  Pulse: 87 77 87 69  Resp: 18 18 18 18   Temp: 98.3 F (36.8 C) 98 F (36.7 C) 98.5 F (36.9 C) 97.9 F (36.6 C)  TempSrc: Oral Oral Oral Oral  SpO2:   100%   Weight:      Height:       General: alert, cooperative and no distress Lochia: appropriate Uterine Fundus:firm Incision: N/A DVT Evaluation:No evidence of DVT seen on physical exam. Negative Homan's sign. No cords or calf tenderness. No significant calf/ankle  edema. Labs: Recent Labs       Lab Results  Component Value Date   WBC 12.4 (H) 05/31/2016   HGB 10.8 (L) 05/31/2016   HCT 31.2 (L) 05/31/2016   MCV 88.6 05/31/2016   PLT 225 05/31/2016     No flowsheet data found.  Discharge instruction: per After Visit Summary and "Baby and Me Booklet".    Diet: routine diet  Activity:Advance as tolerated. Pelvic rest for 6 weeks.   Outpatient follow up:6 weeks Follow up Appt:No future appointments. Follow up Visit:No Follow-up on file.  Postpartum contraception: Nexplanon  Newborn Data: Live born female  Birth Weight: 8 lb 2.9 oz (3711 g) APGAR: 8, 9  Baby Feeding:Breast Disposition:home with mother   06/02/2016 Kathryn Stephens, Student-MidWife       Electronically signed by Queen BlossomAshley H Stephens, Student-MidWife at 06/02/2016 7:32 AM Electronically signed by Kathryn Stephens, CNM at 06/07/2016 3:52 PM

## 2016-07-04 ENCOUNTER — Emergency Department (HOSPITAL_COMMUNITY)
Admission: EM | Admit: 2016-07-04 | Discharge: 2016-07-04 | Disposition: A | Discharge status: Home / Self Care | Payer: Medicaid Other | Attending: Emergency Medicine | Admitting: Emergency Medicine

## 2016-07-04 ENCOUNTER — Encounter (HOSPITAL_COMMUNITY): Payer: Self-pay

## 2016-07-04 DIAGNOSIS — Z7251 High risk heterosexual behavior: Secondary | ICD-10-CM

## 2016-07-04 DIAGNOSIS — Z30012 Encounter for prescription of emergency contraception: Secondary | ICD-10-CM | POA: Diagnosis not present

## 2016-07-04 LAB — I-STAT BETA HCG BLOOD, ED (MC, WL, AP ONLY): I-stat hCG, quantitative: <5 m[IU]/mL (ref <5)

## 2016-07-04 MED ORDER — ULIPRISTAL ACETATE 30 MG PO TABS
30.0000 mg | ORAL_TABLET | Freq: Once | ORAL | Status: AC
  Administered 2016-07-04: 30 mg via ORAL
  Filled 2016-07-04: qty 1

## 2016-07-04 NOTE — ED Triage Notes (Signed)
Pt presents to the ED for emergency contraception. Pt states she just had a baby 4 weeks ago and would like to receive Plan B. Pt states [facility] and [facility] were closing so they told her to come to the ED.

## 2016-07-04 NOTE — ED Provider Notes (Signed)
[facility]-EMERGENCY DEPT Provider Note   CSN: [id number] Arrival date & time: 07/04/16  [id number]  By signing my name below, I, Clovis PuAvnee Patel, attest that this documentation has been prepared under the direction and in the presence of  [facility] Mischelle Reeg, PA-C. Electronically Signed: Clovis PuAvnee Patel, ED Scribe. 07/04/16. 7:15 PM.  History   Chief Complaint Chief Complaint  Patient presents with  . Emergency Contraception    The history is provided by the patient. No language interpreter was used.   HPI Comments:  Kathryn Stephens is a 25 y.o. female who presents to the Emergency Department needing emergency contraceptive. Pt states she recently delivered a child on 05/31/16. She notes the delivery was a non-compilcated vaginal delivery. Pt notes intermittent mild vaginal bleeding. She had unprotected sex 2 nights ago with a new partner. Pt denies any abdominal pain, vomiting, vaginal pain, and abnormal vaginal discharge. She notes body aches and has sick contact at home. Pt denies any other complaints at this time. [facility]  [facility].     Past Medical History:  Diagnosis Date  . Anxiety   . Medical history non-contributory     Patient Active Problem List   Diagnosis Date Noted  . Active labor at term 05/31/2016    Past Surgical History:  Procedure Laterality Date  . TONSILLECTOMY      OB History    Gravida Para Term Preterm AB Living   3 1 1   1 1    SAB TAB Ectopic Multiple Live Births   1       1       Home Medications    Prior to Admission medications   Medication Sig Start Date End Date Taking? Authorizing Provider  ibuprofen (ADVIL,MOTRIN) 600 MG tablet Take 1 tablet (600 mg total) by mouth every 6 (six) hours. 06/02/16   Catalina AntiguaPeggy Constant, MD  Prenatal Vit-Fe Fumarate-FA (PRENATAL MULTIVITAMIN) TABS tablet Take 1 tablet by mouth daily at 12 noon.     Historical Provider, MD    Family  History No family history on file.  Social History Social History  Substance Use Topics  . Smoking status: Never Smoker  . Smokeless tobacco: Never Used  . Alcohol use No     Allergies   Tramadol   Review of Systems Review of Systems  Gastrointestinal: Negative for abdominal pain and vomiting.  Genitourinary: Positive for vaginal bleeding (mild). Negative for vaginal discharge and vaginal pain.  All other systems reviewed and are negative.    Physical Exam Updated Vital Signs BP 130/89 (BP Location: Right Arm)   Pulse 79   Temp 98.3 F (36.8 C) (Oral)   Resp 16   Ht 5\' 1"  (1.549 m)   Wt 63.5 kg   SpO2 100%   BMI 26.45 kg/m   Physical Exam  Constitutional: She appears well-developed and well-nourished. No distress.  HENT:  Head: Normocephalic and atraumatic.  Neck: Neck supple.  Pulmonary/Chest: Effort normal.  Neurological: She is alert.  Skin: She is not diaphoretic.  Nursing note and vitals reviewed.    ED Treatments / Results  DIAGNOSTIC STUDIES:  Oxygen Saturation is 100% on RA, normal by my interpretation.    COORDINATION OF CARE:  7:11 PM Discussed treatment plan with pt at bedside and pt agreed to plan.  Labs (all labs ordered are listed, but only abnormal results are displayed) Labs Reviewed  I-STAT BETA HCG BLOOD, ED (  [facility], [facility], [facility] ONLY)    EKG  EKG Interpretation None       Radiology No results found.  Procedures Procedures (including critical care time)  Medications Ordered in ED Medications  ulipristal acetate (ELLA) tablet 30 mg (30 mg Oral Given 07/04/16 1941)     Initial Impression / Assessment and Plan / ED Course  I have reviewed the triage vital signs and the nursing notes.  Pertinent labs & imaging results that were available during my care of the patient were reviewed by me and considered in my medical decision making (see chart for details).  Clinical Course    Afebrile, nontoxic patient with request for Plan  B following unprotected sexual intercourse.  Pt denies any abnormal vaginal discharge or abdominal pain.  This was a new partner.  We discussed risk for STD exposure and rules following vaginal delivery.  Pt advised to follow up with her OB, ASAP.  Also discussed going directly to [facility] for any concerning infectious-type symptoms.   Breastfeeding_ per manufacturer, breastfeeding to be avoided 24 hours after emergency contraception dose.  Pt advised.  D/C home with follow up instructions, return precautions.  Discussed result, findings, treatment, and follow up  with patient.  Pt given return precautions.  Pt verbalizes understanding and agrees with plan.       Final Clinical Impressions(s) / ED Diagnoses   Final diagnoses:  Unprotected sexual intercourse    New Prescriptions New Prescriptions   No medications on file   I personally performed the services described in this documentation, which was scribed in my presence. The recorded information has been reviewed and is accurate.     Trixie Dredge, PA-C 07/04/16 1956    Rolan Bucco, MD 07/04/16 2223

## 2016-07-04 NOTE — Discharge Instructions (Signed)
Read the information below.  You may return to the Emergency Department at any time for worsening condition or any new symptoms that concern you. °

## 2019-06-25 ENCOUNTER — Emergency Department (HOSPITAL_COMMUNITY)
Admission: EM | Admit: 2019-06-25 | Discharge: 2019-06-25 | Disposition: A | Discharge status: Home / Self Care | Payer: Self-pay | Attending: Emergency Medicine | Admitting: Emergency Medicine

## 2019-06-25 ENCOUNTER — Encounter (HOSPITAL_COMMUNITY): Payer: Self-pay

## 2019-06-25 ENCOUNTER — Other Ambulatory Visit: Payer: Self-pay

## 2019-06-25 DIAGNOSIS — Y999 Unspecified external cause status: Secondary | ICD-10-CM | POA: Insufficient documentation

## 2019-06-25 DIAGNOSIS — Y939 Activity, unspecified: Secondary | ICD-10-CM | POA: Insufficient documentation

## 2019-06-25 DIAGNOSIS — S0502XA Injury of conjunctiva and corneal abrasion without foreign body, left eye, initial encounter: Secondary | ICD-10-CM | POA: Insufficient documentation

## 2019-06-25 DIAGNOSIS — X58XXXA Exposure to other specified factors, initial encounter: Secondary | ICD-10-CM | POA: Insufficient documentation

## 2019-06-25 DIAGNOSIS — Y929 Unspecified place or not applicable: Secondary | ICD-10-CM | POA: Insufficient documentation

## 2019-06-25 MED ORDER — FLUORESCEIN SODIUM 1 MG OP STRP
1.0000 | ORAL_STRIP | Freq: Once | OPHTHALMIC | Status: AC
  Administered 2019-06-25: 19:00:00 1 via OPHTHALMIC
  Filled 2019-06-25: qty 1

## 2019-06-25 MED ORDER — TETRACAINE HCL 0.5 % OP SOLN
2.0000 [drp] | Freq: Once | OPHTHALMIC | Status: AC
  Administered 2019-06-25: 2 [drp] via OPHTHALMIC
  Filled 2019-06-25: qty 4

## 2019-06-25 MED ORDER — ERYTHROMYCIN 5 MG/GM OP OINT: TOPICAL_OINTMENT | OPHTHALMIC | 0 refills | Status: AC

## 2019-06-25 NOTE — ED Notes (Signed)
Irrigated pt's left eye at this time

## 2019-06-25 NOTE — ED Notes (Signed)
Discharge instructions discussed with pt including prescription, pain management with OTC tylenol/ibuprofen, ice, and follow up care. Pt verbalized understanding with no questions at this time.

## 2019-06-25 NOTE — Discharge Instructions (Signed)
Apply antibiotic ointment to the lower lid 4 times daily for the next 7 days. You can take 1 to 2 tablets of Tylenol (350mg -1000mg  depending on the dose) every 6 hours as needed for pain.  Do not exceed 4000 mg of Tylenol daily.  If your pain persists you can take a doses of ibuprofen in between doses of Tylenol.  I usually recommend 400 to 600 mg of ibuprofen every 6 hours.  Take this with food to avoid upset stomach issues.   Can apply ice/cool compresses to the eye to help with swelling or pain.  You may find it helpful to wear sunglasses to help with light sensitivity.  Call ophthalmology to set up a follow-up appointment in the next 48 to 72 hours.  Return to the emergency department if any concerning signs or symptoms develop such as severe swelling, loss of vision, high fevers, pain with eye movements or restriction of eye movements.

## 2019-06-25 NOTE — ED Provider Notes (Signed)
[facility] EMERGENCY DEPARTMENT Provider Note   CSN: [id number] Arrival date & time: 06/25/19  1803     History   Chief Complaint No chief complaint on file.   HPI Kathryn Stephens is a 28 y.o. female with history of anxiety presents for evaluation of acute onset, progressively worsening left eye pain.  Symptoms began around 10:40 AM this morning when she was using an exfoliating scrub to wash her face.  She believes some of the scrub made its way into her left eye and since then she has been having progressively worsening burning pain.  Also notes some photosensitivity and clear tearful drainage.  Notes some mild blurred vision when she initially opens her eye after it being closed for a long time but otherwise no vision changes.  No pain with eye movements.  No fevers.  She does not wear contact lenses.  She has tried Visine eyedrops which were a little bit helpful and also bought a home eye flushing kit from the pharmacy which she reports made her symptoms worse after flushing with normal saline.  She is up-to-date on her tetanus.     The history is provided by the patient.    Past Medical History:  Diagnosis Date  . Anxiety   . Medical history non-contributory     Patient Active Problem List   Diagnosis Date Noted  . Active labor at term 05/31/2016    Past Surgical History:  Procedure Laterality Date  . TONSILLECTOMY       OB History    Gravida  3   Para  1   Term  1   Preterm      AB  1   Living  1     SAB  1   TAB      Ectopic      Multiple      Live Births  1            Home Medications    Prior to Admission medications   Medication Sig Start Date End Date Taking? Authorizing Provider  erythromycin ophthalmic ointment Place a 1/2 inch ribbon of ointment into the lower eyelid 4 times daily for 7 days 06/25/19   Rodell Perna A, PA-C  ibuprofen (ADVIL,MOTRIN) 600 MG tablet Take 1 tablet (600 mg total) by mouth every 6 (six)  hours. 06/02/16   Constant, Peggy, MD  Prenatal Vit-Fe Fumarate-FA (PRENATAL MULTIVITAMIN) TABS tablet Take 1 tablet by mouth daily at 12 noon.     [provider]    Family History History reviewed. No pertinent family history.  Social History Social History   Tobacco Use  . Smoking status: Never Smoker  . Smokeless tobacco: Never Used  Substance Use Topics  . Alcohol use: No  . Drug use: No     Allergies   Tramadol   Review of Systems Review of Systems  Constitutional: Negative for fever.  HENT: Negative for facial swelling.   Eyes: Positive for photophobia, pain, discharge and redness. Negative for itching and visual disturbance.  Neurological: Negative for headaches.     Physical Exam Updated Vital Signs BP 119/75   Pulse 93   Temp 99.3 F (37.4 C) (Oral)   Resp 20   SpO2 100%   Physical Exam Vitals signs and nursing note reviewed.  Constitutional:      General: She is not in acute distress.    Appearance: She is well-developed.  HENT:     Head: Normocephalic and  atraumatic.  Eyes:     General: No scleral icterus.       Right eye: No discharge.        Left eye: Discharge present.    Extraocular Movements: Extraocular movements intact.     Pupils: Pupils are equal, round, and reactive to light.     Comments: Left eye with conjunctival injection, no chemosis, proptosis, or consensual photophobia.  Small amount of clear tearful drainage.  No pain or restriction with eye movements.  No periorbital edema or ecchymosis.  No tenderness to palpation around the orbits.  On fluorescein stain patient has a small corneal abrasion to the superior aspect of the left cornea.  No ulceration, dendritic lesion, foreign body, or rust rings noted.  Seidel sign absent.    Visual Acuity  Right Eye Distance: 20/20 Left Eye Distance: 20/20 Bilateral Distance: 20/20    pH of left eye 7  Neck:     Musculoskeletal: Normal range of motion and neck supple.      Vascular: No JVD.     Trachea: No tracheal deviation.  Cardiovascular:     Rate and Rhythm: Normal rate.  Pulmonary:     Effort: Pulmonary effort is normal.  Abdominal:     General: There is no distension.  Skin:    General: Skin is warm and dry.     Findings: No erythema.  Neurological:     Mental Status: She is alert.  Psychiatric:        Behavior: Behavior normal.      ED Treatments / Results  Labs (all labs ordered are listed, but only abnormal results are displayed) Labs Reviewed - No data to display  EKG None  Radiology No results found.  Procedures Procedures (including critical care time)  Medications Ordered in ED Medications  fluorescein ophthalmic strip 1 strip (1 strip Left Eye Given 06/25/19 1919)  tetracaine (PONTOCAINE) 0.5 % ophthalmic solution 2 drop (2 drops Left Eye Given 06/25/19 1920)     Initial Impression / Assessment and Plan / ED Course  I have reviewed the triage vital signs and the nursing notes.  Pertinent labs & imaging results that were available during my care of the patient were reviewed by me and considered in my medical decision making (see chart for details).          Patient presenting for evaluation of left eye pain after washing her face with an exfoliating facial scrub.  She is afebrile, initially mildly tachycardic with resolution on reevaluation and she is nontoxic in appearance.  No signs of secondary skin infection.  Her tetanus is up-to-date.  Physical examination is consistent with corneal abrasion.  Her eye was irrigated with sterile water, no evidence of foreign body.  pH of conjunctiva 7.  No change in vision, acuity equal bilaterally.  No evidence of glaucoma.  Patient is not a contact lens wearer.  Exam non-concerning for orbital cellulitis, hyphema, corneal ulcers. Patient will be discharged home with erythromycin.  She will follow-up with ophthalmology in the next 2 to 3 days.  Discussed strict ED return  precautions. Pt verbalized understanding of and agreement with plan and is safe for discharge home at this time.     Final Clinical Impressions(s) / ED Diagnoses   Final diagnoses:  Abrasion of left cornea, initial encounter    ED Discharge Orders         Ordered    erythromycin ophthalmic ointment     06/25/19 1920  Renita Papa, PA-C 06/25/19 1942    Davonna Belling, MD 06/25/19 2329

## 2019-07-07 ENCOUNTER — Other Ambulatory Visit: Payer: Self-pay

## 2019-07-07 ENCOUNTER — Emergency Department (HOSPITAL_COMMUNITY)
Admission: EM | Admit: 2019-07-07 | Discharge: 2019-07-07 | Disposition: A | Discharge status: Home / Self Care | Payer: Medicaid Other | Attending: Emergency Medicine | Admitting: Emergency Medicine

## 2019-07-07 DIAGNOSIS — Z5321 Procedure and treatment not carried out due to patient leaving prior to being seen by health care provider: Secondary | ICD-10-CM | POA: Insufficient documentation

## 2019-07-07 DIAGNOSIS — K625 Hemorrhage of anus and rectum: Secondary | ICD-10-CM | POA: Insufficient documentation

## 2019-07-07 LAB — CBC
HCT: 39.1 % (ref 36.0–46.0)
Hemoglobin: 13.2 g/dL (ref 12.0–15.0)
MCH: 31 pg (ref 26.0–34.0)
MCHC: 33.8 g/dL (ref 30.0–36.0)
MCV: 91.8 fL (ref 80.0–100.0)
Platelets: 342 10*3/uL (ref 150–400)
RBC: 4.26 MIL/uL (ref 3.87–5.11)
RDW: 13.1 % (ref 11.5–15.5)
WBC: 12.1 10*3/uL — ABNORMAL HIGH (ref 4.0–10.5)
nRBC: 0 % (ref 0.0–0.2)

## 2019-07-07 LAB — COMPREHENSIVE METABOLIC PANEL
ALT: 13 U/L (ref 0–44)
AST: 15 U/L (ref 15–41)
Albumin: 4.3 g/dL (ref 3.5–5.0)
Alkaline Phosphatase: 110 U/L (ref 38–126)
Anion gap: 13 (ref 5–15)
BUN: 11 mg/dL (ref 6–20)
CO2: 20 mmol/L — ABNORMAL LOW (ref 22–32)
Calcium: 9.7 mg/dL (ref 8.9–10.3)
Chloride: 105 mmol/L (ref 98–111)
Creatinine, Ser: 0.8 mg/dL (ref 0.44–1.00)
GFR calc Af Amer: >60 mL/min (ref >60)
GFR calc non Af Amer: >60 mL/min (ref >60)
Glucose, Bld: 125 mg/dL — ABNORMAL HIGH (ref 70–99)
Potassium: 2.8 mmol/L — ABNORMAL LOW (ref 3.5–5.1)
Sodium: 138 mmol/L (ref 135–145)
Total Bilirubin: 0.4 mg/dL (ref 0.3–1.2)
Total Protein: 8.5 g/dL — ABNORMAL HIGH (ref 6.5–8.1)

## 2019-07-07 LAB — TYPE AND SCREEN
ABO/RH(D): A POS
Antibody Screen: NEGATIVE

## 2019-07-07 LAB — ABO/RH: ABO/RH(D): A POS

## 2019-07-07 LAB — I-STAT BETA HCG BLOOD, ED (MC, WL, AP ONLY): I-stat hCG, quantitative: <5 m[IU]/mL (ref <5)

## 2019-07-07 NOTE — ED Notes (Signed)
Pt states that she will follow up with her pcp in the morning, she doesn't want to stay because of the wait time. Staff advised her to stay, pt still decided to leave.

## 2019-07-07 NOTE — ED Triage Notes (Signed)
Brought pt back to triage, pt is anxious and tachypneic with cramping in hand.  Pt states that she had rectal bleeding about hte amount you would have with a period when she wiped this pm

## 2019-07-08 ENCOUNTER — Encounter (HOSPITAL_COMMUNITY): Payer: Self-pay

## 2019-07-08 ENCOUNTER — Emergency Department (HOSPITAL_COMMUNITY)
Admission: EM | Admit: 2019-07-08 | Discharge: 2019-07-08 | Disposition: A | Discharge status: Home / Self Care | Payer: Self-pay | Attending: Emergency Medicine | Admitting: Emergency Medicine

## 2019-07-08 ENCOUNTER — Emergency Department (HOSPITAL_COMMUNITY): Payer: Self-pay

## 2019-07-08 ENCOUNTER — Other Ambulatory Visit: Payer: Self-pay

## 2019-07-08 DIAGNOSIS — Z79899 Other long term (current) drug therapy: Secondary | ICD-10-CM | POA: Insufficient documentation

## 2019-07-08 DIAGNOSIS — R1030 Lower abdominal pain, unspecified: Secondary | ICD-10-CM | POA: Insufficient documentation

## 2019-07-08 DIAGNOSIS — K602 Anal fissure, unspecified: Secondary | ICD-10-CM | POA: Insufficient documentation

## 2019-07-08 LAB — CBC
HCT: 38.8 % (ref 36.0–46.0)
Hemoglobin: 13.1 g/dL (ref 12.0–15.0)
MCH: 30.8 pg (ref 26.0–34.0)
MCHC: 33.8 g/dL (ref 30.0–36.0)
MCV: 91.1 fL (ref 80.0–100.0)
Platelets: 328 10*3/uL (ref 150–400)
RBC: 4.26 MIL/uL (ref 3.87–5.11)
RDW: 12.9 % (ref 11.5–15.5)
WBC: 12.8 10*3/uL — ABNORMAL HIGH (ref 4.0–10.5)
nRBC: 0 % (ref 0.0–0.2)

## 2019-07-08 LAB — URINALYSIS, ROUTINE W REFLEX MICROSCOPIC
Bilirubin Urine: NEGATIVE
Glucose, UA: NEGATIVE mg/dL
Hgb urine dipstick: NEGATIVE
Ketones, ur: 80 mg/dL — AB
Leukocytes,Ua: NEGATIVE
Nitrite: NEGATIVE
Protein, ur: NEGATIVE mg/dL
Specific Gravity, Urine: 1.025 (ref 1.005–1.030)
pH: 5 (ref 5.0–8.0)

## 2019-07-08 LAB — BASIC METABOLIC PANEL
Anion gap: 11 (ref 5–15)
BUN: 8 mg/dL (ref 6–20)
CO2: 22 mmol/L (ref 22–32)
Calcium: 9.9 mg/dL (ref 8.9–10.3)
Chloride: 106 mmol/L (ref 98–111)
Creatinine, Ser: 0.81 mg/dL (ref 0.44–1.00)
GFR calc Af Amer: >60 mL/min (ref >60)
GFR calc non Af Amer: >60 mL/min (ref >60)
Glucose, Bld: 94 mg/dL (ref 70–99)
Potassium: 3.5 mmol/L (ref 3.5–5.1)
Sodium: 139 mmol/L (ref 135–145)

## 2019-07-08 LAB — I-STAT BETA HCG BLOOD, ED (MC, WL, AP ONLY): I-stat hCG, quantitative: <5 m[IU]/mL (ref <5)

## 2019-07-08 LAB — TROPONIN I (HIGH SENSITIVITY)
Troponin I (High Sensitivity): 3 ng/L (ref <18)
Troponin I (High Sensitivity): 3 ng/L (ref <18)

## 2019-07-08 LAB — TSH: TSH: 1.787 u[IU]/mL (ref 0.350–4.500)

## 2019-07-08 NOTE — ED Notes (Signed)
Pt wishes to wait in her car phone number in chart please call when room ready.

## 2019-07-08 NOTE — ED Triage Notes (Signed)
Pt arrives POV for eval of K+2.8 yesterday, associated palpitations and chest discomfort. Pt reports hx of same this week, denies known cardiac problems.

## 2019-07-08 NOTE — Discharge Instructions (Signed)
You can use either docusate or Colace as a stool softener.  Also fiber and warm soaks in the tub with Epson salt will help.  Continue to stay hydrated and avoid wine.  You will be contacted if your thyroid studies are abnormal.  As soon as your insurance starts to follow-up with the [facility] clinic within a month to have a repeat Pap smear.

## 2019-07-09 NOTE — ED Provider Notes (Signed)
[facility] EMERGENCY DEPARTMENT Provider Note   CSN: [id number] Arrival date & time: 07/08/19  1224     History   Chief Complaint Chief Complaint  Patient presents with   Abnormal Lab   Palpitations    HPI Kathryn Stephens is a 28 y.o. female.     Patient is a 28 year old female with a history of anxiety who is presenting today with several complaints.  Patient initially presented to the emergency room yesterday because she had a painful bowel movement with bright red blood on the toilet paper after a bowel movement and lower abdominal pain.  She states after seeing the blood in her stool she became very anxious with palpitations and lightheadedness.  She did have blood work drawn in the emergency room yesterday but states the wait was so long she chose to go home.  When she spoke with someone this morning they told her she had hypokalemia with potassium of 2.8 and that she should return to the emergency room.  Patient has now been here for 8 hours and states she has had an intermittent headache today, ongoing burning lower abdominal pain with occasional dysuria and ongoing burning rectal pain.  She has had no nausea or vomiting but has not had anything to eat today.  She has had no cough congestion or fever.  Patient has persistent vaginal discharge which is unchanged for years she is only sexually active with her husband.  She recently came off Depo and had her first.  At the end of September and is due for her next menses in the next few days.  She has no chest pain or shortness of breath.  No family history of MI or DVT.  She does state that she had an abnormal Pap smear and was supposed to have repeat testing but lost her insurance and has not been back to the OB/GYN.  The history is provided by the patient.  Abnormal Lab Time since result:  Hypokalemia Patient referred by:  ED personnel Resulting agency:  Internal Result type: chemistry   Palpitations   Past  Medical History:  Diagnosis Date   Anxiety    Medical history non-contributory     Patient Active Problem List   Diagnosis Date Noted   Active labor at term 05/31/2016    Past Surgical History:  Procedure Laterality Date   TONSILLECTOMY       OB History    Gravida  3   Para  1   Term  1   Preterm      AB  1   Living  1     SAB  1   TAB      Ectopic      Multiple      Live Births  1            Home Medications    Prior to Admission medications   Medication Sig Start Date End Date Taking? Authorizing Provider  acetaminophen (TYLENOL) 500 MG tablet Take 500-1,000 mg by mouth every 8 (eight) hours as needed for headache.    Yes [provider]  escitalopram (LEXAPRO) 10 MG tablet Take 10 mg by mouth at bedtime. 12/23/18  Yes [provider]  Multiple Vitamins-Calcium (ONE-A-DAY WOMENS FORMULA) TABS Take 1 tablet by mouth daily with breakfast.   Yes [provider]  erythromycin ophthalmic ointment Place a 1/2 inch ribbon of ointment into the lower eyelid 4 times daily for 7 days Patient not taking:  Reported on 07/08/2019 06/25/19   Rodell Perna A, PA-C  ibuprofen (ADVIL,MOTRIN) 600 MG tablet Take 1 tablet (600 mg total) by mouth every 6 (six) hours. Patient not taking: Reported on 07/08/2019 06/02/16   Constant, Vickii Chafe, MD    Family History History reviewed. No pertinent family history.  Social History Social History   Tobacco Use   Smoking status: Never Smoker   Smokeless tobacco: Never Used  Substance Use Topics   Alcohol use: No   Drug use: No     Allergies   Tramadol   Review of Systems Review of Systems  Cardiovascular: Positive for palpitations.  Gastrointestinal:       Chronic constipation and large volume stool  All other systems reviewed and are negative.    Physical Exam Updated Vital Signs BP 112/69 (BP Location: Right Arm)    Pulse 76    Temp 99 F (37.2 C) (Oral)    Resp 16    Ht 5\' 1"   (1.549 m)    Wt 79.8 kg    LMP 06/11/2019    SpO2 100%    BMI 33.25 kg/m   Physical Exam Vitals signs and nursing note reviewed.  Constitutional:      General: She is in acute distress.     Appearance: She is well-developed.  HENT:     Head: Normocephalic and atraumatic.     Mouth/Throat:     Mouth: Mucous membranes are moist.  Eyes:     Pupils: Pupils are equal, round, and reactive to light.  Cardiovascular:     Rate and Rhythm: Normal rate and regular rhythm.     Pulses: Normal pulses.     Heart sounds: Normal heart sounds. No murmur. No friction rub.  Pulmonary:     Effort: Pulmonary effort is normal.     Breath sounds: Normal breath sounds. No wheezing or rales.  Abdominal:     General: Bowel sounds are normal. There is no distension.     Palpations: Abdomen is soft.     Tenderness: There is abdominal tenderness in the right lower quadrant, suprapubic area and left lower quadrant. There is no guarding or rebound.  Genitourinary:    Comments: Anal fissure along the posterior aspect of the rectum Musculoskeletal: Normal range of motion.        General: No tenderness.     Comments: No edema  Skin:    General: Skin is warm and dry.     Findings: No rash.  Neurological:     General: No focal deficit present.     Mental Status: She is alert and oriented to person, place, and time. Mental status is at baseline.     Cranial Nerves: No cranial nerve deficit.  Psychiatric:        Behavior: Behavior normal.      ED Treatments / Results  Labs (all labs ordered are listed, but only abnormal results are displayed) Labs Reviewed  CBC - Abnormal; Notable for the following components:      Result Value   WBC 12.8 (*)    All other components within normal limits  URINALYSIS, ROUTINE W REFLEX MICROSCOPIC - Abnormal; Notable for the following components:   Ketones, ur 80 (*)    All other components within normal limits  BASIC METABOLIC PANEL  TSH  I-STAT BETA HCG BLOOD, ED  ([facility], [facility], [facility] ONLY)  TROPONIN I (HIGH SENSITIVITY)  TROPONIN I (HIGH SENSITIVITY)    EKG EKG Interpretation  Date/Time:  Friday July 08 2019 13:21:34 EDT Ventricular Rate:  114 PR Interval:  142 QRS Duration: 90 QT Interval:  314 QTC Calculation: 432 R Axis:   73 Text Interpretation:  Sinus tachycardia Right atrial enlargement Left ventricular hypertrophy with repolarization abnormality ( Sokolow-Lyon ) Confirmed by Gwyneth SproutPlunkett, Laurieanne Galloway ([id number]) on 07/08/2019 8:27:38 PM Also confirmed by Gwyneth SproutPlunkett, Arial Galligan ([id number])  on 07/08/2019 9:20:00 PM   Radiology Dg Chest 2 View  Result Date: 07/08/2019 CLINICAL DATA:  Chest pain EXAM: CHEST - 2 VIEW COMPARISON:  None. FINDINGS: The heart size and mediastinal contours are within normal limits. Both lungs are clear. The visualized skeletal structures are unremarkable. IMPRESSION: No active cardiopulmonary disease. Electronically Signed   By: Jasmine PangKim  Fujinaga M.D.   On: 07/08/2019 15:26    Procedures Procedures (including critical care time)  Medications Ordered in ED Medications - No data to display   Initial Impression / Assessment and Plan / ED Course  I have reviewed the triage vital signs and the nursing notes.  Pertinent labs & imaging results that were available during my care of the patient were reviewed by me and considered in my medical decision making (see chart for details).       Patient presenting with multiple vague complaints.  Symptoms started yesterday after she had a painful bowel movement with bright red blood on the stool.  Since that time she has had some intermittent abdominal pain and occasional dysuria.  Patient denies any vaginal discharge and has low risk for STI.  On exam patient has minimal lower quadrant abdominal pain but no rebound or guarding concerning for appendicitis, diverticulitis.  Patient's labs including a troponin, TSH, UA are within normal limits.  Patient has a chronic leukocytosis of 12,000 of unknown  significance but a normal hemoglobin.  Yesterday patient's labs showed hypokalemia but on repeat labs today BMP is within normal limits.  Patient upon arrival to the emergency room yesterday had significant tachycardia with a heart rate in the 150s.  This is improved to heart rate of 100 upon arrival here today which is now improved to 75 on exam in the room.  Patient has no chest pain, shortness of breath, cough or fever.  Low suspicion for dissection, PE or ACS.  Patient does have an abnormal EKG with sinus tachycardia as well as repolarization abnormality but she does not have symptoms of syncope, chest pain and no evidence of WPW or Brugada's today.  Patient does have a anal fissure on exam but no evidence of thrombosed hemorrhoid.  Patient has no evidence of UTI today.  Potassium has resolved spontaneously.  She states she has been drinking quite a bit of wine lately and recommended she avoid this for the future.  Also her insurance starts again within the next 10 days and she is going to follow-up with OB/GYN for repeat Pap smear.  Lower abdominal pain may also be related to ovarian cyst or premenstrual symptoms.  Low suspicion for STI today or ovarian torsion.  Patient is well-appearing eating and drinking.  Vital signs are reassuring and hCG is negative.  We will just.  Charge patient home to follow-up as an outpatient.  Final Clinical Impressions(s) / ED Diagnoses   Final diagnoses:  Anal fissure  Lower abdominal pain    ED Discharge Orders    None       Gwyneth SproutPlunkett, Dyamon Sosinski, MD 07/09/19 [phone]

## 2019-07-16 ENCOUNTER — Encounter (HOSPITAL_COMMUNITY): Payer: Self-pay | Admitting: Emergency Medicine

## 2019-07-16 ENCOUNTER — Emergency Department (HOSPITAL_COMMUNITY)
Admission: EM | Admit: 2019-07-16 | Discharge: 2019-07-16 | Disposition: A | Discharge status: Home / Self Care | Payer: Medicaid Other | Attending: Emergency Medicine | Admitting: Emergency Medicine

## 2019-07-16 ENCOUNTER — Other Ambulatory Visit: Payer: Self-pay

## 2019-07-16 DIAGNOSIS — F4322 Adjustment disorder with anxiety: Secondary | ICD-10-CM | POA: Insufficient documentation

## 2019-07-16 LAB — URINALYSIS, ROUTINE W REFLEX MICROSCOPIC
Bilirubin Urine: NEGATIVE
Glucose, UA: NEGATIVE mg/dL
Ketones, ur: NEGATIVE mg/dL
Leukocytes,Ua: NEGATIVE
Nitrite: NEGATIVE
Protein, ur: NEGATIVE mg/dL
Specific Gravity, Urine: 1.006 (ref 1.005–1.030)
pH: 6 (ref 5.0–8.0)

## 2019-07-16 LAB — I-STAT BETA HCG BLOOD, ED (MC, WL, AP ONLY): I-stat hCG, quantitative: <5 m[IU]/mL (ref <5)

## 2019-07-16 LAB — CBC WITH DIFFERENTIAL/PLATELET
Abs Immature Granulocytes: 0.02 10*3/uL (ref 0.00–0.07)
Basophils Absolute: 0.1 10*3/uL (ref 0.0–0.1)
Basophils Relative: 1 %
Eosinophils Absolute: 0.2 10*3/uL (ref 0.0–0.5)
Eosinophils Relative: 2 %
HCT: 37.6 % (ref 36.0–46.0)
Hemoglobin: 12.6 g/dL (ref 12.0–15.0)
Immature Granulocytes: 0 %
Lymphocytes Relative: 33 %
Lymphs Abs: 3.3 10*3/uL (ref 0.7–4.0)
MCH: 31 pg (ref 26.0–34.0)
MCHC: 33.5 g/dL (ref 30.0–36.0)
MCV: 92.6 fL (ref 80.0–100.0)
Monocytes Absolute: 0.7 10*3/uL (ref 0.1–1.0)
Monocytes Relative: 7 %
Neutro Abs: 5.9 10*3/uL (ref 1.7–7.7)
Neutrophils Relative %: 57 %
Platelets: 346 10*3/uL (ref 150–400)
RBC: 4.06 MIL/uL (ref 3.87–5.11)
RDW: 13 % (ref 11.5–15.5)
WBC: 10 10*3/uL (ref 4.0–10.5)
nRBC: 0 % (ref 0.0–0.2)

## 2019-07-16 LAB — BASIC METABOLIC PANEL
Anion gap: 10 (ref 5–15)
BUN: 7 mg/dL (ref 6–20)
CO2: 22 mmol/L (ref 22–32)
Calcium: 9.5 mg/dL (ref 8.9–10.3)
Chloride: 105 mmol/L (ref 98–111)
Creatinine, Ser: 0.8 mg/dL (ref 0.44–1.00)
GFR calc Af Amer: >60 mL/min (ref >60)
GFR calc non Af Amer: >60 mL/min (ref >60)
Glucose, Bld: 99 mg/dL (ref 70–99)
Potassium: 3.7 mmol/L (ref 3.5–5.1)
Sodium: 137 mmol/L (ref 135–145)

## 2019-07-16 MED ORDER — LORAZEPAM 0.5 MG PO TABS: 0.5000 mg | ORAL_TABLET | Freq: Three times a day (TID) | ORAL | 0 refills | Status: AC

## 2019-07-16 NOTE — ED Triage Notes (Signed)
Patient reports polyuria for several days , denies dysuria or fever , no hematuria.

## 2019-07-16 NOTE — ED Provider Notes (Signed)
[facility] EMERGENCY DEPARTMENT Provider Note   CSN: [id number] Arrival date & time: 07/16/19  0021     History   Chief Complaint Chief Complaint  Patient presents with  . Polyuria    HPI Kathryn Stephens is a 28 y.o. female.     28 year old female presents to the emergency department with multiple complaints.  She ultimately states that she has not felt like herself for more than a week.  Symptoms include episodes of palpitations with feeling short of breath.  She was prompted to come to the ED tonight after palpitations woke her from sleep.  She had an overwhelming feeling of "doom".  She will have tingling in her face and hands at times.  Felt nauseous earlier in the day yesterday with one episode of vomiting and one episode of looser stool.  She also feels that she has been urinating more frequently than normal without associated dysuria, hematuria, fever.    This is the patient's fourth visit to the emergency department in the past week.  She had extensive laboratory testing on 07/07/2019 as well as the following day on 07/08/2019.  Recently lost her job due to a need to take care of her 3 children who are enrolled in virtual learning.  This has added some financial stress to her home situation.  She constantly feels as though there is a "dark cloud" over her house.  She has not been sleeping well at nighttime.  She does endorse a history of anxiety, but is not currently on medication for this.  She has follow-up scheduled with her primary doctor on Monday.  The history is provided by the patient. No language interpreter was used.    Past Medical History:  Diagnosis Date  . Anxiety   . Medical history non-contributory     Patient Active Problem List   Diagnosis Date Noted  . Active labor at term 05/31/2016    Past Surgical History:  Procedure Laterality Date  . TONSILLECTOMY       OB History    Gravida  3   Para  1   Term  1   Preterm      AB  1   Living  1     SAB  1   TAB      Ectopic      Multiple      Live Births  1            Home Medications    Prior to Admission medications   Medication Sig Start Date End Date Taking? Authorizing Provider  acetaminophen (TYLENOL) 500 MG tablet Take 500-1,000 mg by mouth every 8 (eight) hours as needed for headache.     [provider]  erythromycin ophthalmic ointment Place a 1/2 inch ribbon of ointment into the lower eyelid 4 times daily for 7 days Patient not taking: Reported on 07/08/2019 06/25/19   Rodell Perna A, PA-C  escitalopram (LEXAPRO) 10 MG tablet Take 10 mg by mouth at bedtime. 12/23/18   [provider]  ibuprofen (ADVIL,MOTRIN) 600 MG tablet Take 1 tablet (600 mg total) by mouth every 6 (six) hours. Patient not taking: Reported on 07/08/2019 06/02/16   Constant, Peggy, MD  LORazepam (ATIVAN) 0.5 MG tablet Take 1 tablet (0.5 mg total) by mouth every 8 (eight) hours. 07/16/19   Antonietta Breach, PA-C  Multiple Vitamins-Calcium (ONE-A-DAY WOMENS FORMULA) TABS Take 1 tablet by mouth daily with breakfast.    [provider]  Family History No family history on file.  Social History Social History   Tobacco Use  . Smoking status: Never Smoker  . Smokeless tobacco: Never Used  Substance Use Topics  . Alcohol use: No  . Drug use: No     Allergies   Tramadol   Review of Systems Review of Systems Ten systems reviewed and are negative for acute change, except as noted in the HPI.    Physical Exam Updated Vital Signs BP 105/79 (BP Location: Left Arm)   Pulse 77   Temp 98.6 F (37 C) (Oral)   Resp 16   LMP 06/11/2019   SpO2 100%   Physical Exam Vitals signs and nursing note reviewed.  Constitutional:      General: She is not in acute distress.    Appearance: She is well-developed. She is not diaphoretic.     Comments: Nontoxic appearing and in NAD  HENT:     Head: Normocephalic and atraumatic.  Eyes:      General: No scleral icterus.    Conjunctiva/sclera: Conjunctivae normal.  Neck:     Musculoskeletal: Normal range of motion.  Pulmonary:     Effort: Pulmonary effort is normal. No respiratory distress.     Breath sounds: No stridor.     Comments: Respirations even and unlabored Musculoskeletal: Normal range of motion.  Skin:    General: Skin is warm and dry.     Coloration: Skin is not pale.     Findings: No erythema or rash.  Neurological:     General: No focal deficit present.     Mental Status: She is alert and oriented to person, place, and time.     Coordination: Coordination normal.     Comments: GCS 15. Speech is goal oriented. Ambulatory with steady gait.  Psychiatric:        Mood and Affect: Mood is anxious.        Behavior: Behavior normal.      ED Treatments / Results  Labs (all labs ordered are listed, but only abnormal results are displayed) Labs Reviewed  URINALYSIS, ROUTINE W REFLEX MICROSCOPIC - Abnormal; Notable for the following components:      Result Value   Color, Urine STRAW (*)    Hgb urine dipstick SMALL (*)    Bacteria, UA MANY (*)    All other components within normal limits  URINE CULTURE  CBC WITH DIFFERENTIAL/PLATELET  BASIC METABOLIC PANEL  I-STAT BETA HCG BLOOD, ED ([facility], [facility], [facility] ONLY)    EKG None  Radiology No results found.  Procedures Procedures (including critical care time)  Medications Ordered in ED Medications - No data to display   Initial Impression / Assessment and Plan / ED Course  I have reviewed the triage vital signs and the nursing notes.  Pertinent labs & imaging results that were available during my care of the patient were reviewed by me and considered in my medical decision making (see chart for details).        28 year old female presenting for a myriad of symptoms.  Upon further discussion with the patient and investigation into her recent history, I feel that her symptoms are all explained by onset of  acute adjustment reaction with heightened anxiety.  During my encounter with the patient, she appears physically stressed.  Upon further discussion with her, she states that our conversation has greatly helped her mood and allowed her to feel like a "weight" has been lifted off her shoulders.  Upon expressing this  to me she becomes overwhelmed by a flight of emotion and tearful.  I have provided the patient reassurance that her work-up in the ED today is without acute abnormality.  Have discussed the importance of proper sleep habits and advised against the use of caffeine.  She has follow-up scheduled with her primary care doctor on Monday.  In the interim, will start on low-dose Ativan to take as needed.  I have counseled patient on the side effects of use of this medication.  She may benefit from future use of a daily mood stabilizer.  Patient agreeable with plan for PCP follow up.  All questions answered.  Return precautions discussed and provided. Patient discharged in stable condition with no unaddressed concerns.   Final Clinical Impressions(s) / ED Diagnoses   Final diagnoses:  Adjustment reaction with anxiety    ED Discharge Orders         Ordered    LORazepam (ATIVAN) 0.5 MG tablet  Every 8 hours     07/16/19 0524           Antonietta Breach, PA-C 47/30/85 [id number]    Delora Fuel, MD 70/05/25 [phone]

## 2019-07-16 NOTE — Discharge Instructions (Signed)
Your work-up in the emergency department today was reassuring.  We recommend follow-up with your primary care doctor as scheduled.  Your symptoms are suspected to be due to adjustment reaction with heightened anxiety.  Try to avoid caffeinated beverages such as coffee and soda.  You have been given a prescription for Ativan to take as needed for management of anxiety.  This medication may make you drowsy; do not drive or drink alcohol after taking.  You may return to the ED for any new or concerning symptoms.

## 2019-07-17 LAB — URINE CULTURE

## 2021-02-21 IMAGING — DX DG CHEST 2V
2 series · 2 of 2 positions shown · non-contrast
Comparison: None.

CLINICAL DATA: Chest pain

EXAM:
CHEST - 2 VIEW

[chest pa]
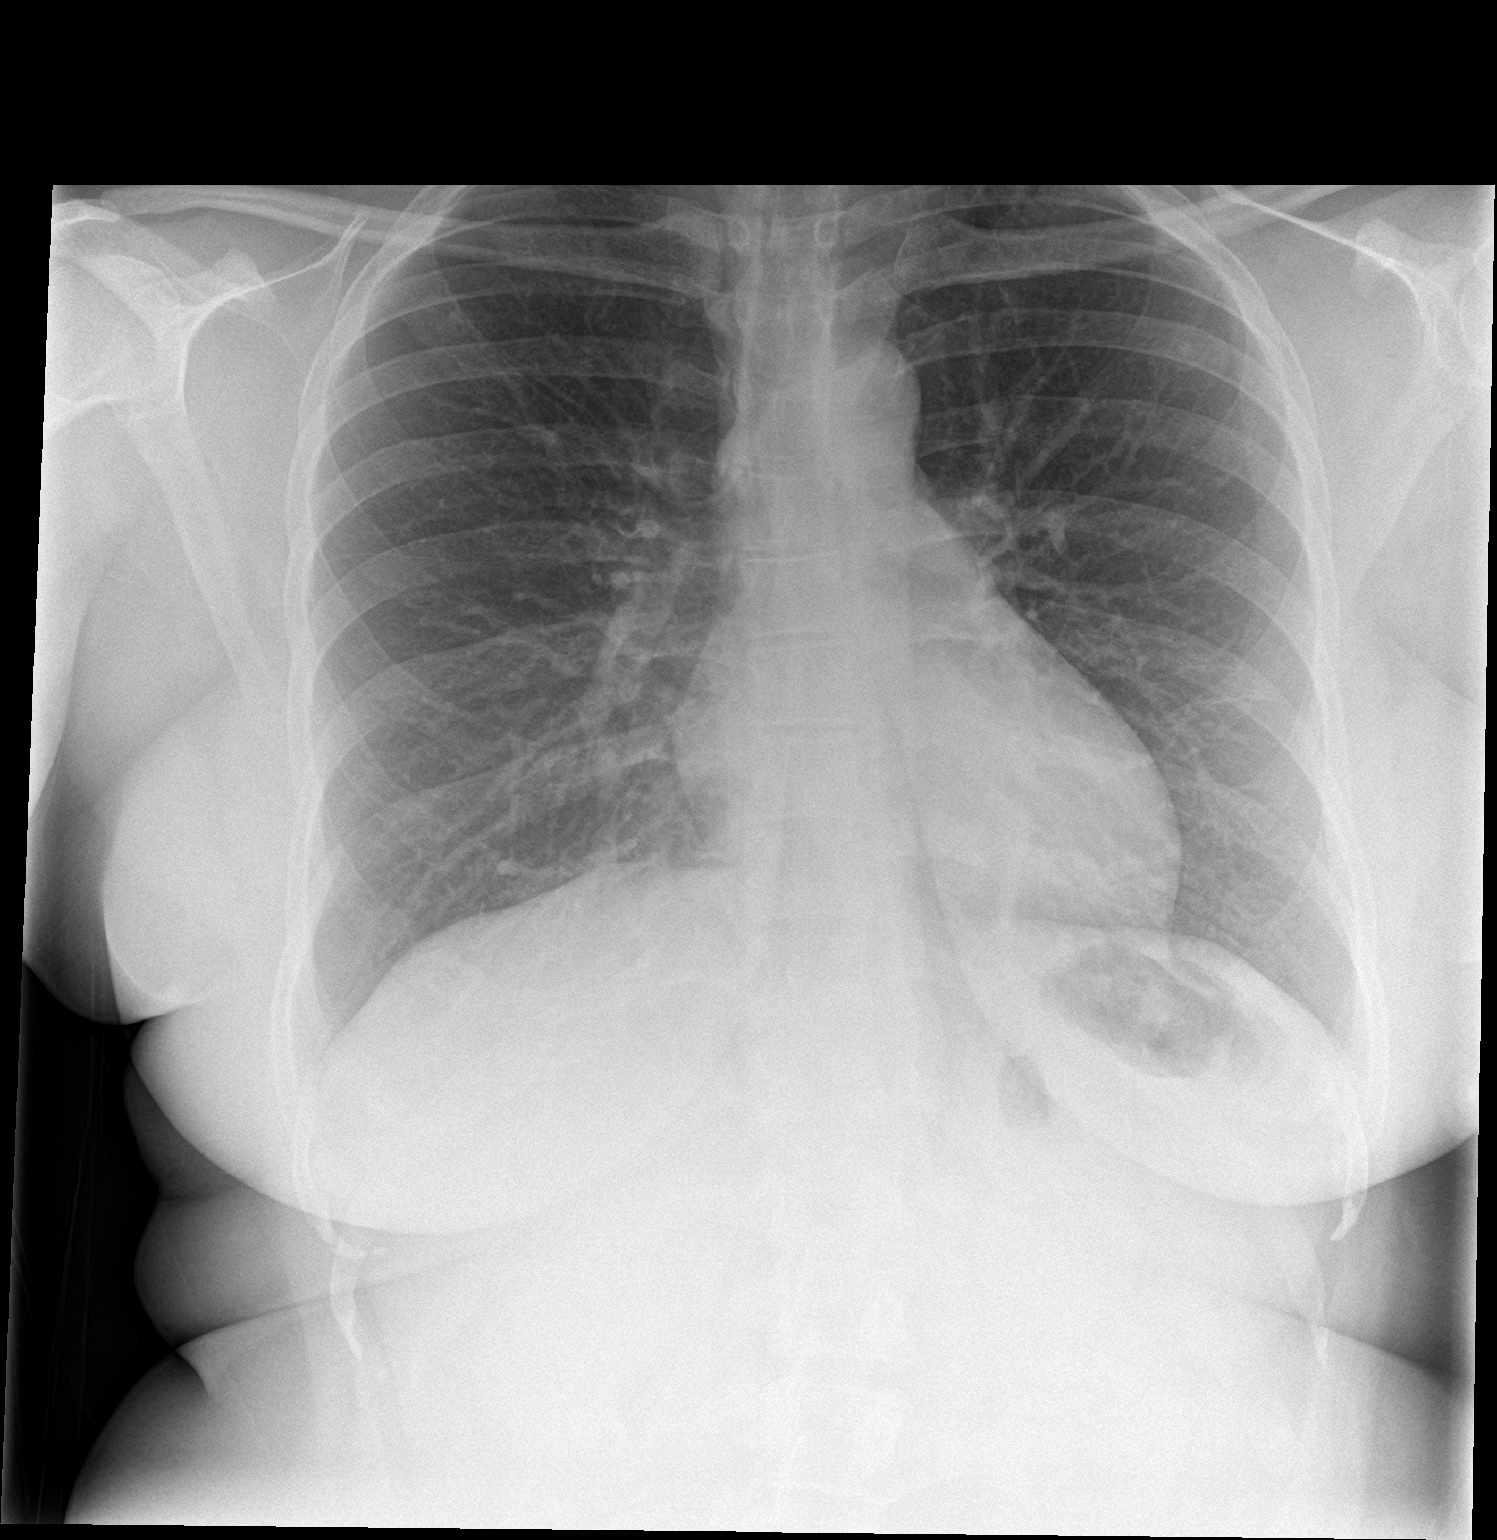

[chest lat]
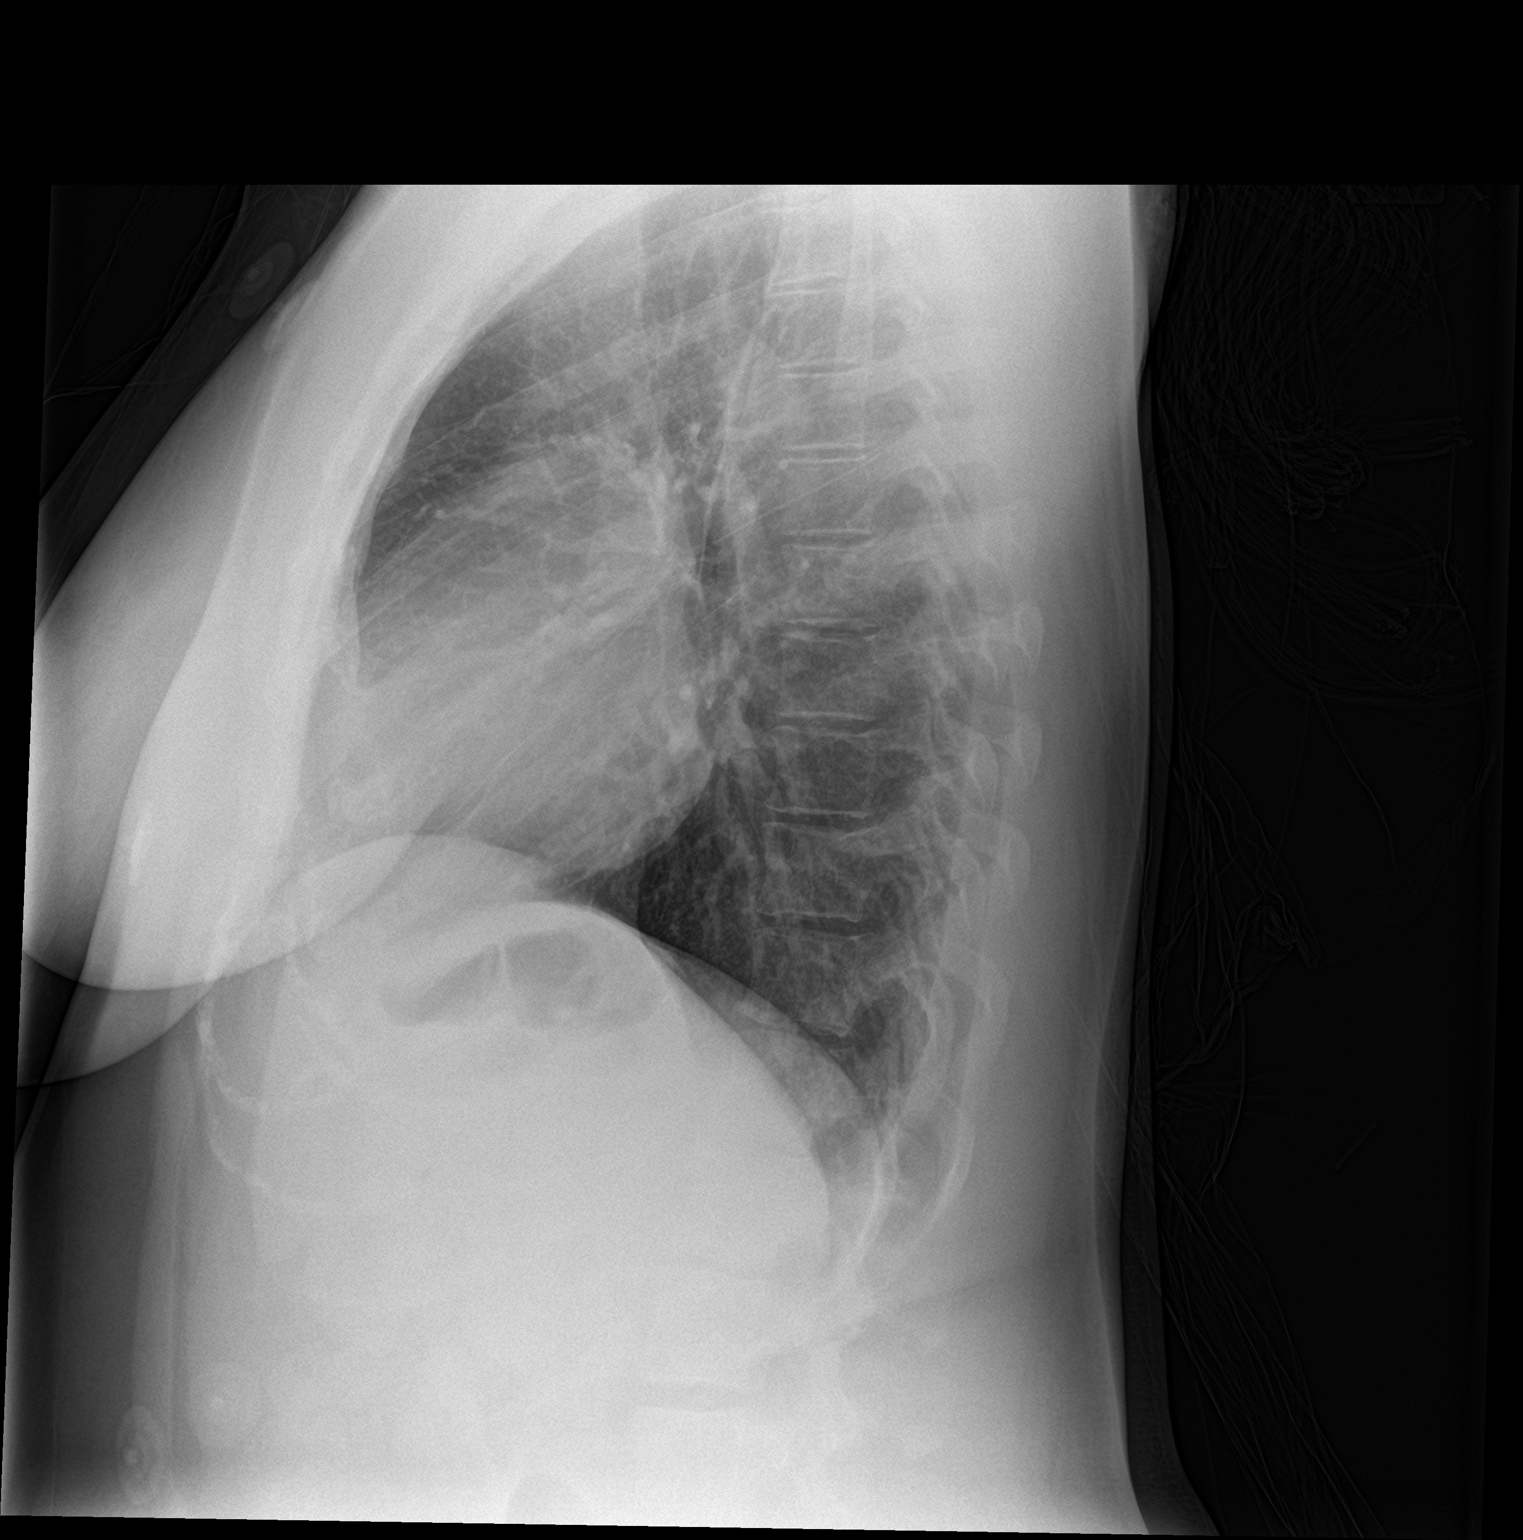

[2 of 2 positions shown; findings below may reference images not displayed]

FINDINGS: The heart size and mediastinal contours are within normal limits.
Both lungs are clear. The visualized skeletal structures are
unremarkable.
IMPRESSION: No active cardiopulmonary disease.
